# Patient Record
Sex: Female | Born: 1995
Health system: Southern US, Community
[De-identification: ages and names within clinical notes are randomized; demographics above are authoritative.]

## PROBLEM LIST (undated history)

## (undated) ENCOUNTER — Inpatient Hospital Stay (HOSPITAL_COMMUNITY): Payer: Self-pay

## (undated) DIAGNOSIS — F410 Panic disorder [episodic paroxysmal anxiety] without agoraphobia: Secondary | ICD-10-CM

## (undated) DIAGNOSIS — R51 Headache: Secondary | ICD-10-CM

## (undated) DIAGNOSIS — R112 Nausea with vomiting, unspecified: Secondary | ICD-10-CM

## (undated) DIAGNOSIS — Z9889 Other specified postprocedural states: Secondary | ICD-10-CM

## (undated) DIAGNOSIS — R519 Headache, unspecified: Secondary | ICD-10-CM

## (undated) DIAGNOSIS — I10 Essential (primary) hypertension: Secondary | ICD-10-CM

## (undated) HISTORY — PX: WISDOM TOOTH EXTRACTION: SHX21

---
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
## 2013-04-09 ENCOUNTER — Encounter (HOSPITAL_COMMUNITY): Payer: Self-pay | Admitting: Emergency Medicine

## 2013-04-09 ENCOUNTER — Emergency Department (HOSPITAL_COMMUNITY)
Admission: EM | Admit: 2013-04-09 | Discharge: 2013-04-09 | Disposition: A | Discharge status: Home / Self Care | Payer: Self-pay | Attending: Emergency Medicine | Admitting: Emergency Medicine

## 2013-04-09 DIAGNOSIS — Z79899 Other long term (current) drug therapy: Secondary | ICD-10-CM | POA: Insufficient documentation

## 2013-04-09 DIAGNOSIS — Z3202 Encounter for pregnancy test, result negative: Secondary | ICD-10-CM | POA: Insufficient documentation

## 2013-04-09 DIAGNOSIS — Z880 Allergy status to penicillin: Secondary | ICD-10-CM | POA: Insufficient documentation

## 2013-04-09 DIAGNOSIS — F410 Panic disorder [episodic paroxysmal anxiety] without agoraphobia: Secondary | ICD-10-CM | POA: Insufficient documentation

## 2013-04-09 DIAGNOSIS — J029 Acute pharyngitis, unspecified: Secondary | ICD-10-CM | POA: Insufficient documentation

## 2013-04-09 DIAGNOSIS — I10 Essential (primary) hypertension: Secondary | ICD-10-CM | POA: Insufficient documentation

## 2013-04-09 DIAGNOSIS — N390 Urinary tract infection, site not specified: Secondary | ICD-10-CM | POA: Insufficient documentation

## 2013-04-09 HISTORY — DX: Essential (primary) hypertension: I10

## 2013-04-09 HISTORY — DX: Panic disorder (episodic paroxysmal anxiety): F41.0

## 2013-04-09 LAB — URINE MICROSCOPIC-ADD ON

## 2013-04-09 LAB — URINALYSIS, ROUTINE W REFLEX MICROSCOPIC
Bilirubin Urine: NEGATIVE
Glucose, UA: NEGATIVE mg/dL
KETONES UR: NEGATIVE mg/dL
NITRITE: NEGATIVE
Protein, ur: 30 mg/dL — AB
SPECIFIC GRAVITY, URINE: 1.02 (ref 1.005–1.030)
UROBILINOGEN UA: 1 mg/dL (ref 0.0–1.0)
pH: 7 (ref 5.0–8.0)

## 2013-04-09 LAB — POC URINE PREG, ED: Preg Test, Ur: NEGATIVE

## 2013-04-09 MED ORDER — CEPHALEXIN 500 MG PO CAPS: 500.0000 mg | ORAL_CAPSULE | Freq: Four times a day (QID) | ORAL | Status: DC

## 2013-04-09 MED ORDER — IBUPROFEN 800 MG PO TABS
800.0000 mg | ORAL_TABLET | Freq: Once | ORAL | Status: AC
  Administered 2013-04-09: 800 mg via ORAL
  Filled 2013-04-09: qty 1

## 2013-04-09 MED ORDER — IBUPROFEN 800 MG PO TABS: 800.0000 mg | ORAL_TABLET | Freq: Three times a day (TID) | ORAL | Status: DC

## 2013-04-09 MED ORDER — CEPHALEXIN 500 MG PO CAPS
500.0000 mg | ORAL_CAPSULE | Freq: Once | ORAL | Status: AC
  Administered 2013-04-09: 500 mg via ORAL
  Filled 2013-04-09: qty 1

## 2013-04-09 NOTE — ED Notes (Signed)
Patient given discharge instruction, verbalized understand. Patient ambulatory out of the department.  

## 2013-04-09 NOTE — Discharge Instructions (Signed)
Pharyngitis °Pharyngitis is a sore throat (pharynx). There is redness, pain, and swelling of your throat. °HOME CARE  °· Drink enough fluids to keep your pee (urine) clear or pale yellow. °· Only take medicine as told by your doctor. °· You may get sick again if you do not take medicine as told. Finish your medicines, even if you start to feel better. °· Do not take aspirin. °· Rest. °· Rinse your mouth (gargle) with salt water (½ tsp of salt per 1 qt of water) every 1 2 hours. This will help the pain. °· If you are not at risk for choking, you can suck on hard candy or sore throat lozenges. °GET HELP IF: °· You have large, tender lumps on your neck. °· You have a rash. °· You cough up green, yellow-brown, or bloody spit. °GET HELP RIGHT AWAY IF:  °· You have a stiff neck. °· You drool or cannot swallow liquids. °· You throw up (vomit) or are not able to keep medicine or liquids down. °· You have very bad pain that does not go away with medicine. °· You have problems breathing (not from a stuffy nose). °MAKE SURE YOU:  °· Understand these instructions. °· Will watch your condition. °· Will get help right away if you are not doing well or get worse. °Document Released: 06/15/2007 Document Revised: 10/17/2012 Document Reviewed: 09/03/2012 °[employer]® Patient Information ©2014 [employer]. ° °

## 2013-04-09 NOTE — ED Provider Notes (Signed)
CSN: [id number]     Arrival date & time 04/09/13  1100 History   First MD Initiated Contact with Patient 04/09/13 1231     Chief Complaint  Patient presents with  . Sore Throat  . Back Pain     (Consider location/radiation/quality/duration/timing/severity/associated sxs/prior Treatment) HPI Comments: Patient is a 18 year old female who presents to the emergency department with complaint of sore throat and back pain. The patient states that the sore throat is been going on for 2-3 days. Seems to be getting progressively worse. The patient states that she can swallow liquids. She's not had any high fever that she is aware of. She has been around other people have been ill.  The patient describes the back pain. She describes increasing urine frequency. She also states that her urine looks much more dark and concentrated than usual. It is near her menstrual cycle. She's not had any other symptoms related to her urinary tract. His been no injury to the back.  Patient is a 18 y.o. female presenting with pharyngitis and back pain. The history is provided by the patient.  Sore Throat Associated symptoms include a sore throat. Pertinent negatives include no abdominal pain, arthralgias, chest pain, coughing or neck pain.  Back Pain Associated symptoms: no abdominal pain, no chest pain and no dysuria     Past Medical History  Diagnosis Date  . Hypertension   . Panic attacks    History reviewed. No pertinent past surgical history. History reviewed. No pertinent family history. History  Substance Use Topics  . Smoking status: Never Smoker   . Smokeless tobacco: Not on file  . Alcohol Use: No   OB History   Grav Para Term Preterm Abortions TAB SAB Ect Mult Living                 Review of Systems  Constitutional: Negative for activity change.       All ROS Neg except as noted in HPI  HENT: Positive for sore throat. Negative for nosebleeds.   Eyes: Negative for photophobia and discharge.   Respiratory: Negative for cough, shortness of breath and wheezing.   Cardiovascular: Negative for chest pain and palpitations.  Gastrointestinal: Negative for abdominal pain and blood in stool.  Genitourinary: Positive for frequency. Negative for dysuria and hematuria.  Musculoskeletal: Positive for back pain. Negative for arthralgias and neck pain.  Skin: Negative.   Neurological: Negative for dizziness, seizures and speech difficulty.  Psychiatric/Behavioral: Negative for hallucinations and confusion.      Allergies  Amoxicillin and Other  Home Medications   Current Outpatient Rx  Name  Route  Sig  Dispense  Refill  . FLUoxetine (PROZAC) 20 MG capsule   Oral   Take 20 mg by mouth daily.          BP 152/86  Pulse 83  Temp(Src) 98.7 F (37.1 C) (Oral)  Resp 12  Ht 5\' 4"  (1.626 m)  Wt 130 lb 12.8 oz (59.33 kg)  BMI 22.44 kg/m2  SpO2 100%  LMP 03/15/2013 Physical Exam  Nursing note and vitals reviewed. Constitutional: She is oriented to person, place, and time. She appears well-developed and well-nourished.  Non-toxic appearance.  HENT:  Head: Normocephalic.  Right Ear: Tympanic membrane and external ear normal.  Left Ear: Tympanic membrane and external ear normal.  Increase redness present. Airway patent. Speech clear.  Eyes: EOM and lids are normal. Pupils are equal, round, and reactive to light.  Neck: Normal range of motion. Neck supple.  Carotid bruit is not present.  Cardiovascular: Normal rate, regular rhythm, normal heart sounds, intact distal pulses and normal pulses.   Pulmonary/Chest: Breath sounds normal. No respiratory distress.  Abdominal: Soft. Bowel sounds are normal. There is no tenderness. There is no guarding.  Mild left CVA tenderness.  Musculoskeletal: Normal range of motion.  Lower back pain to ROm.  Lymphadenopathy:       Head (right side): No submandibular adenopathy present.       Head (left side): No submandibular adenopathy present.     She has no cervical adenopathy.  Neurological: She is alert and oriented to person, place, and time. She has normal strength. No cranial nerve deficit or sensory deficit.  Skin: Skin is warm and dry.  Psychiatric: She has a normal mood and affect. Her speech is normal.    ED Course  Procedures (including critical care time) Labs Review Labs Reviewed  URINALYSIS, ROUTINE W REFLEX MICROSCOPIC - Abnormal; Notable for the following:    Color, Urine BROWN (*)    APPearance CLOUDY (*)    Hgb urine dipstick LARGE (*)    Protein, ur 30 (*)    Leukocytes, UA MODERATE (*)    All other components within normal limits  URINE MICROSCOPIC-ADD ON - Abnormal; Notable for the following:    Bacteria, UA FEW (*)    All other components within normal limits  POC URINE PREG, ED   Imaging Review No results found.   EKG Interpretation None      MDM Examination is consistent with pharyngitis. Urinalysis shows a brown cloudy specimen with a large amount of blood and moderate leukocyte esterase. The patient will be placed on Keflex and ibuprofen. A culture of the urine will be sent to the lab. Patient is to return if any changes, problems, or concerns.    Final diagnoses:  None    *I have reviewed nursing notes, vital signs, and all appropriate lab and imaging results for this patient.Kathie Dike**    Gaynor Genco M Jorah Hua, PA-C 04/09/13 [phone]

## 2013-04-09 NOTE — ED Notes (Signed)
Pt now complaining of back pain and having urinary frequency, Urine specimen sent to lab, cloudy pink sample

## 2013-04-09 NOTE — ED Notes (Signed)
Sore throat, headache,  Back hurts. Decreased appetite.

## 2013-04-10 NOTE — ED Provider Notes (Signed)
Medical screening examination/treatment/procedure(s) were performed by non-physician practitioner and as supervising physician I was immediately available for consultation/collaboration.   Celene KrasJon R Tonesha Tsou, MD 04/10/13 [phone]

## 2013-04-12 LAB — URINE CULTURE

## 2013-04-13 ENCOUNTER — Telehealth (HOSPITAL_BASED_OUTPATIENT_CLINIC_OR_DEPARTMENT_OTHER): Payer: Self-pay | Admitting: Emergency Medicine

## 2013-04-13 NOTE — Telephone Encounter (Signed)
Post ED Visit - Positive Culture Follow-up  Culture report reviewed by antimicrobial stewardship pharmacist: [] Wes Dulaney, Pharm.D., BCPS [x] Jeremy Frens, Pharm.D., BCPS [] Elizabeth Martin, Pharm.D., BCPS [] Minh Pham, Pharm.D., BCPS, AAHIVP [] Michelle Turner, Pharm.D., BCPS, AAHIVP  Positive urine culture Treated with Keflex, organism sensitive to the same and no further patient follow-up is required at this time.  Elaine Hernandez 04/13/2013, 10:41 AM   

## 2013-04-24 ENCOUNTER — Emergency Department (HOSPITAL_COMMUNITY)
Admission: EM | Admit: 2013-04-24 | Discharge: 2013-04-25 | Disposition: A | Discharge status: Home / Self Care | Payer: Self-pay | Attending: Emergency Medicine | Admitting: Emergency Medicine

## 2013-04-24 ENCOUNTER — Encounter (HOSPITAL_COMMUNITY): Payer: Self-pay | Admitting: Emergency Medicine

## 2013-04-24 DIAGNOSIS — Z791 Long term (current) use of non-steroidal anti-inflammatories (NSAID): Secondary | ICD-10-CM | POA: Insufficient documentation

## 2013-04-24 DIAGNOSIS — F410 Panic disorder [episodic paroxysmal anxiety] without agoraphobia: Secondary | ICD-10-CM | POA: Insufficient documentation

## 2013-04-24 DIAGNOSIS — R072 Precordial pain: Secondary | ICD-10-CM | POA: Insufficient documentation

## 2013-04-24 DIAGNOSIS — I10 Essential (primary) hypertension: Secondary | ICD-10-CM | POA: Insufficient documentation

## 2013-04-24 DIAGNOSIS — F411 Generalized anxiety disorder: Secondary | ICD-10-CM | POA: Insufficient documentation

## 2013-04-24 DIAGNOSIS — R002 Palpitations: Secondary | ICD-10-CM | POA: Insufficient documentation

## 2013-04-24 DIAGNOSIS — R079 Chest pain, unspecified: Secondary | ICD-10-CM

## 2013-04-24 DIAGNOSIS — Z792 Long term (current) use of antibiotics: Secondary | ICD-10-CM | POA: Insufficient documentation

## 2013-04-24 DIAGNOSIS — Z79899 Other long term (current) drug therapy: Secondary | ICD-10-CM | POA: Insufficient documentation

## 2013-04-24 DIAGNOSIS — Z880 Allergy status to penicillin: Secondary | ICD-10-CM | POA: Insufficient documentation

## 2013-04-24 NOTE — ED Notes (Signed)
Pt c/o sob, palpitations, and chest tightness. Pt states she has a history of panic attacks and took Prozac that they gave her for a panic disorder. Pt states theses symptoms started earlier today and have gotten progressively worse.

## 2013-04-24 NOTE — ED Provider Notes (Signed)
CSN: [id number]     Arrival date & time 04/24/13  2103 History  This chart was scribed for Elaine RollerBrian D Leotha Westermeyer, MD by Danella Maiersaroline Early, ED Scribe. This patient was seen in room APA08/APA08 and the patient's care was started at 11:47 PM.    Chief Complaint  Patient presents with  . Shortness of Breath   The history is provided by the patient. No language interpreter was used.   HPI Comments: April Elaine Hernandez is a [age] y.o. female with a h/o anxiety who presents to the Emergency Department complaining of sudden-onset constant sharp left substernal CP onset tonight with racing heart and heavy breathing. She states the pain worsened with deep breathing. States she came in because she was concerned about her heart. She denies h/o cardiac problems. She states she has been stressed out this week due to grandma being diagnosed with lung cancer and uncle hit by a car yesterday. She states she was diagnosed with anxiety disorder last year at the ER after coming in for a panic attack. She saw a family doc and was prescribed Prozac.     Past Medical History  Diagnosis Date  . Hypertension   . Panic attacks    History reviewed. No pertinent past surgical history. History reviewed. No pertinent family history. History  Substance Use Topics  . Smoking status: Never Smoker   . Smokeless tobacco: Not on file  . Alcohol Use: No   OB History   Grav Para Term Preterm Abortions TAB SAB Ect Mult Living                 Review of Systems  Respiratory: Positive for shortness of breath.   Cardiovascular: Positive for chest pain and palpitations. Negative for leg swelling.  Gastrointestinal: Negative for nausea.  Neurological: Negative for numbness.  Psychiatric/Behavioral: The patient is nervous/anxious.   All other systems reviewed and are negative.     Allergies  Amoxicillin and Other  Home Medications   Prior to Admission medications   Medication Sig Start Date End Date Taking? Authorizing Provider   cephALEXin (KEFLEX) 500 MG capsule Take 1 capsule (500 mg total) by mouth 4 (four) times daily. 04/09/13   Kathie DikeHobson M Bryant, PA-C  FLUoxetine (PROZAC) 20 MG capsule Take 20 mg by mouth daily.    Historical Provider, MD  ibuprofen (ADVIL,MOTRIN) 800 MG tablet Take 1 tablet (800 mg total) by mouth 3 (three) times daily. 04/09/13   Kathie DikeHobson M Bryant, PA-C   BP 143/87  Pulse 97  Resp 20  Ht 5\' 4"  (1.626 m)  Wt 135 lb (61.236 kg)  BMI 23.16 kg/m2  SpO2 100%  LMP 04/14/2013 Physical Exam  Nursing note and vitals reviewed. Constitutional: She appears well-developed and well-nourished. No distress.  HENT:  Head: Normocephalic and atraumatic.  Eyes: Conjunctivae are normal. Right eye exhibits no discharge. Left eye exhibits no discharge.  Cardiovascular: Normal rate and regular rhythm.   No murmur heard. Pulmonary/Chest: Effort normal and breath sounds normal. She exhibits tenderness (Reproducible chest tenderness underneath the right breast over to the chest wall).  Chaperone present  Abdominal: Soft. She exhibits no distension. There is no tenderness. There is no rebound and no guarding.  Musculoskeletal: She exhibits no edema and no tenderness.  Lymphadenopathy:    She has no cervical adenopathy.  Neurological: She is alert. Coordination normal.  Skin: Skin is warm. No rash noted.  Psychiatric: She has a normal mood and affect. Her behavior is normal.    ED Course  Procedures (including critical care time) Medications  naproxen (NAPROSYN) tablet 500 mg (not administered)    DIAGNOSTIC STUDIES: Oxygen Saturation is 100% on RA, normal by my interpretation.    COORDINATION OF CARE: 11:55 PM- Discussed treatment plan with pt. Pt agrees to plan.    Labs Review Labs Reviewed - No data to display  Imaging Review Dg Chest 2 View  04/25/2013   CLINICAL DATA:  Chest pain  EXAM: CHEST  2 VIEW  COMPARISON:  None.  FINDINGS: The heart size and mediastinal contours are within normal  limits. Both lungs are clear. The visualized skeletal structures are unremarkable.  IMPRESSION: No active cardiopulmonary disease.   Electronically Signed   By: Elige KoHetal  Patel   On: 04/25/2013 00:39     EKG Interpretation   Date/Time:  Wednesday April 24 2013 21:10:44 EDT Ventricular Rate:  96 PR Interval:  180 QRS Duration: 82 QT Interval:  356 QTC Calculation: 449 R Axis:   75 Text Interpretation:  Normal sinus rhythm Possible Left atrial enlargement  Abnormal ECG No previous ECGs available Confirmed by Mariaclara Spear  MD, Linnea Todisco  ([id number]) on 04/25/2013 12:46:22 AM      MDM   Final diagnoses:  Palpitations  Chest pain   CXR and ECG normal - pt has reproducible pain to palpation, normal VS, no tachycardia on my exam and no swelling or other rf for PE.  She is stable for d/c at this time.  Naprosyn given.  Meds given in ED:  Medications  naproxen (NAPROSYN) tablet 500 mg (not administered)    New Prescriptions   NAPROXEN (NAPROSYN) 500 MG TABLET    Take 1 tablet (500 mg total) by mouth 2 (two) times daily with a meal.      I personally performed the services described in this documentation, which was scribed in my presence. The recorded information has been reviewed and is accurate.      Elaine RollerBrian D [name], MD 04/25/13 [phone]

## 2013-04-25 ENCOUNTER — Emergency Department (HOSPITAL_COMMUNITY): Payer: Self-pay

## 2013-04-25 MED ORDER — NAPROXEN 250 MG PO TABS
500.0000 mg | ORAL_TABLET | Freq: Once | ORAL | Status: AC
  Administered 2013-04-25: 500 mg via ORAL
  Filled 2013-04-25: qty 2

## 2013-04-25 MED ORDER — NAPROXEN 500 MG PO TABS: 500.0000 mg | ORAL_TABLET | Freq: Two times a day (BID) | ORAL | Status: DC

## 2013-04-25 NOTE — Discharge Instructions (Signed)
Naprosyn twice daily for pain, your heart and lungs look normal on testing.  Please call your doctor for a followup appointment within 24-48 hours. When you talk to your doctor please let them know that you were seen in the emergency department and have them acquire all of your records so that they can discuss the findings with you and formulate a treatment plan to fully care for your new and ongoing problems.

## 2013-06-07 ENCOUNTER — Emergency Department (HOSPITAL_COMMUNITY)
Admission: EM | Admit: 2013-06-07 | Discharge: 2013-06-07 | Disposition: A | Discharge status: Home / Self Care | Payer: Self-pay | Attending: Emergency Medicine | Admitting: Emergency Medicine

## 2013-06-07 ENCOUNTER — Encounter (HOSPITAL_COMMUNITY): Payer: Self-pay | Admitting: Emergency Medicine

## 2013-06-07 DIAGNOSIS — N939 Abnormal uterine and vaginal bleeding, unspecified: Secondary | ICD-10-CM

## 2013-06-07 DIAGNOSIS — F410 Panic disorder [episodic paroxysmal anxiety] without agoraphobia: Secondary | ICD-10-CM | POA: Insufficient documentation

## 2013-06-07 DIAGNOSIS — M549 Dorsalgia, unspecified: Secondary | ICD-10-CM | POA: Insufficient documentation

## 2013-06-07 DIAGNOSIS — Z880 Allergy status to penicillin: Secondary | ICD-10-CM | POA: Insufficient documentation

## 2013-06-07 DIAGNOSIS — Z79899 Other long term (current) drug therapy: Secondary | ICD-10-CM | POA: Insufficient documentation

## 2013-06-07 DIAGNOSIS — R109 Unspecified abdominal pain: Secondary | ICD-10-CM | POA: Insufficient documentation

## 2013-06-07 DIAGNOSIS — N898 Other specified noninflammatory disorders of vagina: Secondary | ICD-10-CM | POA: Insufficient documentation

## 2013-06-07 DIAGNOSIS — Z3202 Encounter for pregnancy test, result negative: Secondary | ICD-10-CM | POA: Insufficient documentation

## 2013-06-07 DIAGNOSIS — I10 Essential (primary) hypertension: Secondary | ICD-10-CM | POA: Insufficient documentation

## 2013-06-07 LAB — URINALYSIS, ROUTINE W REFLEX MICROSCOPIC
BILIRUBIN URINE: NEGATIVE
GLUCOSE, UA: NEGATIVE mg/dL
KETONES UR: NEGATIVE mg/dL
Nitrite: NEGATIVE
Protein, ur: NEGATIVE mg/dL
Specific Gravity, Urine: 1.015 (ref 1.005–1.030)
Urobilinogen, UA: 0.2 mg/dL (ref 0.0–1.0)
pH: 7 (ref 5.0–8.0)

## 2013-06-07 LAB — URINE MICROSCOPIC-ADD ON

## 2013-06-07 LAB — RPR

## 2013-06-07 LAB — HIV ANTIBODY (ROUTINE TESTING W REFLEX): HIV: NONREACTIVE

## 2013-06-07 LAB — PREGNANCY, URINE: Preg Test, Ur: NEGATIVE

## 2013-06-07 MED ORDER — IBUPROFEN 800 MG PO TABS
800.0000 mg | ORAL_TABLET | Freq: Once | ORAL | Status: AC
  Administered 2013-06-07: 800 mg via ORAL
  Filled 2013-06-07: qty 1

## 2013-06-07 NOTE — Care Management Note (Signed)
ED/CM noted patient did not have health insurance and/or PCP listed in the computer.  Patient was given the [place] resource handout with information on the clinics, food pantries, and the handout for new health insurance sign-up.  Patient expressed appreciation for information received. 

## 2013-06-07 NOTE — ED Provider Notes (Signed)
CSN: [id number]     Arrival date & time 06/07/13  0919 History  This chart was scribed for Ward Givens, MD by Shari Heritage, ED Scribe. The patient was seen in room APA09/APA09. Patient's care was started at 10:19 AM.    Chief Complaint  Patient presents with  . Vaginal Bleeding    The history is provided by the patient. No language interpreter was used.    HPI Comments: Elaine Hernandez is a 18 y.o. female who presents to the Emergency Department complaining of constant vaginal bleeding that began 3-4 days ago. She states that her current bleeding is heavier than her normal menstrual bleeding and she has also noticed small clots. She states that her menstrual cycles usually last 4-6 days. She was not having any discharge prior to onset of vaginal bleeding. She was on oral contraceptives, but she has been off them for 1 year and has had normal menstrual cycles every month, until last month, when she did not have a menstrual cycle which prompted her to take a home pregnancy test. Patient took several home pregnancy tests in mid-April - one was positive, one was undiagnostic, and two other were negative. She was seen at [facility] ED for abdominal pain in mid-April as well and had a negative pregnancy test there. Currently, she reports associated breast soreness that began 1 week ago. She is also having abdominal pain and lower back pain. She denies dysuria, urinary frequency, fever, or other symptoms at this time. She had nausea in Mid April, but not now.  She is G0P0Ab0. She has had one partner for the past 6 months and uses prophylactics intermittently.    PCP - Wyvonnia Lora She does not have a GYN.   Past Medical History  Diagnosis Date  . Hypertension   . Panic attacks    History reviewed. No pertinent past surgical history. History reviewed. No pertinent family history. History  Substance Use Topics  . Smoking status: Never Smoker   . Smokeless tobacco: Not on file  . Alcohol Use: No   She works at a call center.  OB History   Grav Para Term Preterm Abortions TAB SAB Ect Mult Living   0 0             Review of Systems  Constitutional: Negative for fever.  Gastrointestinal: Positive for abdominal pain.  Genitourinary: Positive for vaginal bleeding and menstrual problem. Negative for dysuria and frequency.       Positive for breast soreness.  Musculoskeletal: Positive for back pain.  All other systems reviewed and are negative.   Allergies  Amoxicillin and Other  Home Medications   Prior to Admission medications   Medication Sig Start Date End Date Taking? Authorizing Provider  FLUoxetine (PROZAC) 20 MG capsule Take 20 mg by mouth daily.    Historical Provider, MD   BP 138/99  Pulse 99  Temp(Src) 98.8 F (37.1 C) (Oral)  Resp 18  Ht 5\' 4"  (1.626 m)  SpO2 100%  LMP 04/07/2013  Vital signs normal   Physical Exam  Nursing note and vitals reviewed. Constitutional: She is oriented to person, place, and time. She appears well-developed and well-nourished.  Non-toxic appearance. She does not appear ill. No distress.  HENT:  Head: Normocephalic and atraumatic.  Right Ear: External ear normal.  Left Ear: External ear normal.  Nose: Nose normal. No mucosal edema or rhinorrhea.  Mouth/Throat: Oropharynx is clear and moist and mucous membranes are normal. No dental abscesses or uvula  swelling.  Eyes: Conjunctivae and EOM are normal. Pupils are equal, round, and reactive to light.  Neck: Normal range of motion and full passive range of motion without pain. Neck supple.  Cardiovascular: Normal rate, regular rhythm and normal heart sounds.  Exam reveals no gallop and no friction rub.   No murmur heard. Pulmonary/Chest: Effort normal and breath sounds normal. No respiratory distress. She has no wheezes. She has no rhonchi. She has no rales. She exhibits no tenderness and no crepitus.  Abdominal: Soft. Normal appearance and bowel sounds are normal. She exhibits no  distension. There is tenderness. There is no rebound and no guarding.    Mild suprapubic tenderness.  Genitourinary:  ED extremely busy, pt had to leave just as I had time to do her pelvic exam.   Musculoskeletal: Normal range of motion. She exhibits no edema and no tenderness.  Moves all extremities well.   Neurological: She is alert and oriented to person, place, and time. She has normal strength. No cranial nerve deficit.  Skin: Skin is warm, dry and intact. No rash noted. No erythema. No pallor.  Psychiatric: She has a normal mood and affect. Her speech is normal and behavior is normal. Her mood appears not anxious.    ED Course  Procedures (including critical care time)  Medications  ibuprofen (ADVIL,MOTRIN) tablet 800 mg (800 mg Oral Given 06/07/13 1035)    DIAGNOSTIC STUDIES: Oxygen Saturation is 100% on room air, normal by my interpretation.    COORDINATION OF CARE: 10:29 AM- Patient informed of current plan for treatment and evaluation and agrees with plan at this time.  12:59 PM - Patient is improved after ibuprofen. She decided to leave before receiving her pelvic exam.   Results for orders placed during the hospital encounter of 06/07/13  PREGNANCY, URINE      Result Value Ref Range   Preg Test, Ur NEGATIVE  NEGATIVE  URINALYSIS, ROUTINE W REFLEX MICROSCOPIC      Result Value Ref Range   Color, Urine YELLOW  YELLOW   APPearance CLEAR  CLEAR   Specific Gravity, Urine 1.015  1.005 - 1.030   pH 7.0  5.0 - 8.0   Glucose, UA NEGATIVE  NEGATIVE mg/dL   Hgb urine dipstick LARGE (*) NEGATIVE   Bilirubin Urine NEGATIVE  NEGATIVE   Ketones, ur NEGATIVE  NEGATIVE mg/dL   Protein, ur NEGATIVE  NEGATIVE mg/dL   Urobilinogen, UA 0.2  0.0 - 1.0 mg/dL   Nitrite NEGATIVE  NEGATIVE   Leukocytes, UA TRACE (*) NEGATIVE  URINE MICROSCOPIC-ADD ON      Result Value Ref Range   Squamous Epithelial / LPF RARE  RARE   WBC, UA 0-2  <3 WBC/hpf   RBC / HPF 7-10  <3 RBC/hpf    Laboratory interpretation all normal except hematuria (pt on menses, voided sample)   Imaging Review No results found.   EKG Interpretation None      MDM  We had discussed her menses was heavier because she had missed a month, I was going to do a pelvic exam to make sure she didn't have an infection which was making her periods irregular but she had to leave.    Final diagnoses:  Vaginal bleeding   PT left AMA  Devoria AlbeIva Errick Salts, MD, FACEP   I personally performed the services described in this documentation, which was scribed in my presence. The recorded information has been reviewed and considered.  Plan discharge    Khyran Riera Hildred LaserL Reigna Ruperto,  MD 06/07/13 1412

## 2013-06-07 NOTE — ED Notes (Signed)
Pt last period end of May, numerous negative home pregnancy. Wednesday pt experiencing small, that afternoon bleeding heavily with clumps of blood, with severe pain continued into Thursday, pt continues to bleed heavy no dark blood with lower pain and lower abdominal pain.

## 2013-08-30 ENCOUNTER — Emergency Department (HOSPITAL_COMMUNITY): Payer: Self-pay

## 2013-08-30 ENCOUNTER — Emergency Department (HOSPITAL_COMMUNITY)
Admission: EM | Admit: 2013-08-30 | Discharge: 2013-08-30 | Disposition: A | Discharge status: Home / Self Care | Payer: Self-pay | Attending: Emergency Medicine | Admitting: Emergency Medicine

## 2013-08-30 ENCOUNTER — Encounter (HOSPITAL_COMMUNITY): Payer: Self-pay | Admitting: Emergency Medicine

## 2013-08-30 DIAGNOSIS — R5383 Other fatigue: Secondary | ICD-10-CM

## 2013-08-30 DIAGNOSIS — Z880 Allergy status to penicillin: Secondary | ICD-10-CM | POA: Insufficient documentation

## 2013-08-30 DIAGNOSIS — R638 Other symptoms and signs concerning food and fluid intake: Secondary | ICD-10-CM | POA: Insufficient documentation

## 2013-08-30 DIAGNOSIS — N949 Unspecified condition associated with female genital organs and menstrual cycle: Secondary | ICD-10-CM | POA: Insufficient documentation

## 2013-08-30 DIAGNOSIS — R5381 Other malaise: Secondary | ICD-10-CM | POA: Insufficient documentation

## 2013-08-30 DIAGNOSIS — R42 Dizziness and giddiness: Secondary | ICD-10-CM | POA: Insufficient documentation

## 2013-08-30 DIAGNOSIS — N925 Other specified irregular menstruation: Secondary | ICD-10-CM | POA: Insufficient documentation

## 2013-08-30 DIAGNOSIS — Z8659 Personal history of other mental and behavioral disorders: Secondary | ICD-10-CM | POA: Insufficient documentation

## 2013-08-30 DIAGNOSIS — N938 Other specified abnormal uterine and vaginal bleeding: Secondary | ICD-10-CM | POA: Insufficient documentation

## 2013-08-30 DIAGNOSIS — R1031 Right lower quadrant pain: Secondary | ICD-10-CM | POA: Insufficient documentation

## 2013-08-30 DIAGNOSIS — I10 Essential (primary) hypertension: Secondary | ICD-10-CM | POA: Insufficient documentation

## 2013-08-30 DIAGNOSIS — Z3202 Encounter for pregnancy test, result negative: Secondary | ICD-10-CM | POA: Insufficient documentation

## 2013-08-30 DIAGNOSIS — Z79899 Other long term (current) drug therapy: Secondary | ICD-10-CM | POA: Insufficient documentation

## 2013-08-30 LAB — URINE MICROSCOPIC-ADD ON

## 2013-08-30 LAB — URINALYSIS, ROUTINE W REFLEX MICROSCOPIC
Bilirubin Urine: NEGATIVE
GLUCOSE, UA: NEGATIVE mg/dL
KETONES UR: NEGATIVE mg/dL
Nitrite: NEGATIVE
PROTEIN: NEGATIVE mg/dL
Specific Gravity, Urine: 1.015 (ref 1.005–1.030)
Urobilinogen, UA: 1 mg/dL (ref 0.0–1.0)
pH: 7 (ref 5.0–8.0)

## 2013-08-30 LAB — CBC WITH DIFFERENTIAL/PLATELET
Basophils Absolute: 0 10*3/uL (ref 0.0–0.1)
Basophils Relative: 0 % (ref 0–1)
EOS PCT: 5 % (ref 0–5)
Eosinophils Absolute: 0.5 10*3/uL (ref 0.0–0.7)
HCT: 36.1 % (ref 36.0–46.0)
HEMOGLOBIN: 11.9 g/dL — AB (ref 12.0–15.0)
LYMPHS ABS: 3.3 10*3/uL (ref 0.7–4.0)
LYMPHS PCT: 32 % (ref 12–46)
MCH: 25.5 pg — ABNORMAL LOW (ref 26.0–34.0)
MCHC: 33 g/dL (ref 30.0–36.0)
MCV: 77.5 fL — AB (ref 78.0–100.0)
Monocytes Absolute: 0.6 10*3/uL (ref 0.1–1.0)
Monocytes Relative: 6 % (ref 3–12)
NEUTROS ABS: 5.7 10*3/uL (ref 1.7–7.7)
Neutrophils Relative %: 57 % (ref 43–77)
Platelets: 244 10*3/uL (ref 150–400)
RBC: 4.66 MIL/uL (ref 3.87–5.11)
RDW: 14.8 % (ref 11.5–15.5)
WBC: 10.2 10*3/uL (ref 4.0–10.5)

## 2013-08-30 LAB — BASIC METABOLIC PANEL
Anion gap: 11 (ref 5–15)
BUN: 11 mg/dL (ref 6–23)
CHLORIDE: 105 meq/L (ref 96–112)
CO2: 24 mEq/L (ref 19–32)
CREATININE: 0.62 mg/dL (ref 0.50–1.10)
Calcium: 8.8 mg/dL (ref 8.4–10.5)
GFR calc Af Amer: >90 mL/min (ref >90)
GFR calc non Af Amer: >90 mL/min (ref >90)
GLUCOSE: 104 mg/dL — AB (ref 70–99)
Potassium: 3.7 mEq/L (ref 3.7–5.3)
Sodium: 140 mEq/L (ref 137–147)

## 2013-08-30 LAB — PREGNANCY, URINE: PREG TEST UR: NEGATIVE

## 2013-08-30 NOTE — Discharge Instructions (Signed)
Magnesium citrate: Drink 2/3 of a 10 ounce bottle mixed with equal parts Sprite or Gatorade.  Return to the emergency department if you develop high fever, worsening pain, bloody stool, or other new and concerning symptoms.   Abdominal Pain, Women Abdominal (stomach, pelvic, or belly) pain can be caused by many things. It is important to tell your doctor:  The location of the pain.  Does it come and go or is it present all the time?  Are there things that start the pain (eating certain foods, exercise)?  Are there other symptoms associated with the pain (fever, nausea, vomiting, diarrhea)? All of this is helpful to know when trying to find the cause of the pain. CAUSES   Stomach: virus or bacteria infection, or ulcer.  Intestine: appendicitis (inflamed appendix), regional ileitis (Crohn's disease), ulcerative colitis (inflamed colon), irritable bowel syndrome, diverticulitis (inflamed diverticulum of the colon), or cancer of the stomach or intestine.  Gallbladder disease or stones in the gallbladder.  Kidney disease, kidney stones, or infection.  Pancreas infection or cancer.  Fibromyalgia (pain disorder).  Diseases of the female organs:  Uterus: fibroid (non-cancerous) tumors or infection.  Fallopian tubes: infection or tubal pregnancy.  Ovary: cysts or tumors.  Pelvic adhesions (scar tissue).  Endometriosis (uterus lining tissue growing in the pelvis and on the pelvic organs).  Pelvic congestion syndrome (female organs filling up with blood just before the menstrual period).  Pain with the menstrual period.  Pain with ovulation (producing an egg).  Pain with an IUD (intrauterine device, birth control) in the uterus.  Cancer of the female organs.  Functional pain (pain not caused by a disease, may improve without treatment).  Psychological pain.  Depression. DIAGNOSIS  Your doctor will decide the seriousness of your pain by doing an examination.  Blood  tests.  X-rays.  Ultrasound.  CT scan (computed tomography, special type of X-ray).  MRI (magnetic resonance imaging).  Cultures, for infection.  Barium enema (dye inserted in the large intestine, to better view it with X-rays).  Colonoscopy (looking in intestine with a lighted tube).  Laparoscopy (minor surgery, looking in abdomen with a lighted tube).  Major abdominal exploratory surgery (looking in abdomen with a large incision). TREATMENT  The treatment will depend on the cause of the pain.   Many cases can be observed and treated at home.  Over-the-counter medicines recommended by your caregiver.  Prescription medicine.  Antibiotics, for infection.  Birth control pills, for painful periods or for ovulation pain.  Hormone treatment, for endometriosis.  Nerve blocking injections.  Physical therapy.  Antidepressants.  Counseling with a psychologist or psychiatrist.  Minor or major surgery. HOME CARE INSTRUCTIONS   Do not take laxatives, unless directed by your caregiver.  Take over-the-counter pain medicine only if ordered by your caregiver. Do not take aspirin because it can cause an upset stomach or bleeding.  Try a clear liquid diet (broth or water) as ordered by your caregiver. Slowly move to a bland diet, as tolerated, if the pain is related to the stomach or intestine.  Have a thermometer and take your temperature several times a day, and record it.  Bed rest and sleep, if it helps the pain.  Avoid sexual intercourse, if it causes pain.  Avoid stressful situations.  Keep your follow-up appointments and tests, as your caregiver orders.  If the pain does not go away with medicine or surgery, you may try:  Acupuncture.  Relaxation exercises (yoga, meditation).  Group therapy.  Counseling. SEEK  MEDICAL CARE IF:   You notice certain foods cause stomach pain.  Your home care treatment is not helping your pain.  You need stronger pain  medicine.  You want your IUD removed.  You feel faint or lightheaded.  You develop nausea and vomiting.  You develop a rash.  You are having side effects or an allergy to your medicine. SEEK IMMEDIATE MEDICAL CARE IF:   Your pain does not go away or gets worse.  You have a fever.  Your pain is felt only in portions of the abdomen. The right side could possibly be appendicitis. The left lower portion of the abdomen could be colitis or diverticulitis.  You are passing blood in your stools (bright red or black tarry stools, with or without vomiting).  You have blood in your urine.  You develop chills, with or without a fever.  You pass out. MAKE SURE YOU:   Understand these instructions.  Will watch your condition.  Will get help right away if you are not doing well or get worse. Document Released: 10/24/2006 Document Revised: 05/13/2013 Document Reviewed: 11/13/2008 [facility] Patient Information 2015 [place], [place]. This information is not intended to replace advice given to you by your health care provider. Make sure you discuss any questions you have with your health care provider.

## 2013-08-30 NOTE — ED Provider Notes (Signed)
CSN: [id number]     Arrival date & time 08/30/13  2113 History  This chart was scribed for Geoffery Lyonsouglas Elaine Toya, MD, by Yevette EdwardsAngela Bracken, ED Scribe. This patient was seen in room APA19/APA19 and the patient's care was started at 10:00 PM.   First MD Initiated Contact with Patient 08/30/13 2158     No chief complaint on file.   HPI HPI Comments: Elaine Hernandez is a 18 y.o. female who presents to the Emergency Department complaining of RLQ pain which has gradually worsened over the course of two days. The pain is increased with movement. She also endorses weakness, dizziness, and abnormal vaginal bleeding. [name] reports she received a Depo IM last month and had sexual intercourse without protection the week following the injection; she is in a monogamous relationship. She denies a fever, vaginal discharge, dysuria, or difficulty urinating. She also denies a h/o abnormal pelvic exams; her last pelvic exam was three months ago. She denies a h/o abdominal surgeries. She reports a family h/o ovarian cysts.   Past Medical History  Diagnosis Date  . Hypertension   . Panic attacks    History reviewed. No pertinent past surgical history. History reviewed. No pertinent family history. History  Substance Use Topics  . Smoking status: Never Smoker   . Smokeless tobacco: Not on file  . Alcohol Use: No   OB History   Grav Para Term Preterm Abortions TAB SAB Ect Mult Living   0 0             Review of Systems  Constitutional: Positive for unexpected weight change. Negative for fever.  Gastrointestinal: Positive for abdominal pain.  Genitourinary: Positive for vaginal bleeding. Negative for dysuria, vaginal discharge and difficulty urinating.  Neurological: Positive for dizziness and weakness.      Allergies  Amoxicillin and Other  Home Medications   Prior to Admission medications   Medication Sig Start Date End Date Taking? Authorizing Provider  acetaminophen (TYLENOL) 500 MG tablet Take  1,000 mg by mouth every 6 (six) hours as needed.   Yes Historical Provider, MD  medroxyPROGESTERone (DEPO-PROVERA) 150 MG/ML injection Inject 150 mg into the muscle every 3 (three) months.   Yes Historical Provider, MD  Multiple Vitamin (MULTIVITAMIN WITH MINERALS) TABS tablet Take 1 tablet by mouth daily.   Yes Historical Provider, MD   Triage Vitals: BP 154/98  Pulse 96  Temp(Src) 98.9 F (37.2 C)  Resp 20  Ht 5' 4.5" (1.638 m)  Wt 148 lb 5 oz (67.274 kg)  BMI 25.07 kg/m2  SpO2 100%  Physical Exam  Nursing note and vitals reviewed. Constitutional: She is oriented to person, place, and time. She appears well-developed and well-nourished. No distress.  HENT:  Head: Normocephalic and atraumatic.  Eyes: Conjunctivae and EOM are normal.  Neck: Neck supple. No tracheal deviation present.  Cardiovascular: Normal rate and regular rhythm.   No murmur heard. Pulmonary/Chest: Effort normal and breath sounds normal. No respiratory distress.  Abdominal: Soft. She exhibits no distension. There is tenderness. There is no rebound and no guarding.  Tenderness to palpation in the RLQ. No rebound. No guarding.   Musculoskeletal: Normal range of motion.  Neurological: She is alert and oriented to person, place, and time.  Skin: Skin is warm and dry.  Psychiatric: She has a normal mood and affect. Her behavior is normal.    ED Course  Procedures (including critical care time)  DIAGNOSTIC STUDIES: Oxygen Saturation is 100% on room air, normal by my  interpretation.    COORDINATION OF CARE:  10:18 PM- Discussed treatment plan with patient, and the patient agreed to the plan. The plan includes lab work and a CT scan.   Labs Review Labs Reviewed  URINALYSIS, ROUTINE W REFLEX MICROSCOPIC - Abnormal; Notable for the following:    APPearance HAZY (*)    Hgb urine dipstick LARGE (*)    Leukocytes, UA SMALL (*)    All other components within normal limits  URINE MICROSCOPIC-ADD ON - Abnormal;  Notable for the following:    Squamous Epithelial / LPF FEW (*)    Bacteria, UA FEW (*)    All other components within normal limits  CBC WITH DIFFERENTIAL - Abnormal; Notable for the following:    Hemoglobin 11.9 (*)    MCV 77.5 (*)    MCH 25.5 (*)    All other components within normal limits  BASIC METABOLIC PANEL - Abnormal; Notable for the following:    Glucose, Bld 104 (*)    All other components within normal limits  PREGNANCY, URINE    Imaging Review No results found.   EKG Interpretation None      MDM   Final diagnoses:  None    Patient presents with right lower abdominal and right flank pain for the past several days. Her pregnancy is negative, thus ruling out an ectopic pregnancy. She has no white count and no findings on her CT scan to indicate appendicitis. Her urinalysis reveals blood but she is currently on her period. She declines a pelvic exam as she is denying any vaginal discharge and denies any new sexual contacts. Her CT scan does reveal what I feel is a significant amount of stool in the right lower quadrant in the area for discomfort. I will recommend magnesium citrate and when necessary followup.  I personally performed the services described in this documentation, which was scribed in my presence. The recorded information has been reviewed and is accurate.      Geoffery Lyons, MD 08/30/13 [phone]

## 2013-08-30 NOTE — ED Notes (Signed)
Pt c/o abdominal pain, weakness, back pain, headache, and is more thirsty. Pt states she just started the depo shot in July and thinks she may have an ectopic pregnancy. Pt has multiple complaints.

## 2013-10-16 ENCOUNTER — Emergency Department (HOSPITAL_COMMUNITY)
Admission: EM | Admit: 2013-10-16 | Discharge: 2013-10-16 | Disposition: A | Discharge status: Home / Self Care | Payer: Self-pay | Attending: Emergency Medicine | Admitting: Emergency Medicine

## 2013-10-16 ENCOUNTER — Encounter (HOSPITAL_COMMUNITY): Payer: Self-pay | Admitting: Emergency Medicine

## 2013-10-16 DIAGNOSIS — Z792 Long term (current) use of antibiotics: Secondary | ICD-10-CM | POA: Insufficient documentation

## 2013-10-16 DIAGNOSIS — Z880 Allergy status to penicillin: Secondary | ICD-10-CM | POA: Insufficient documentation

## 2013-10-16 DIAGNOSIS — Z8659 Personal history of other mental and behavioral disorders: Secondary | ICD-10-CM | POA: Insufficient documentation

## 2013-10-16 DIAGNOSIS — I10 Essential (primary) hypertension: Secondary | ICD-10-CM | POA: Insufficient documentation

## 2013-10-16 DIAGNOSIS — J039 Acute tonsillitis, unspecified: Secondary | ICD-10-CM

## 2013-10-16 LAB — RAPID STREP SCREEN (MED CTR MEBANE ONLY): Streptococcus, Group A Screen (Direct): NEGATIVE

## 2013-10-16 MED ORDER — MAGIC MOUTHWASH W/LIDOCAINE: 10.0000 mL | Freq: Four times a day (QID) | ORAL | Status: DC | PRN

## 2013-10-16 MED ORDER — AZITHROMYCIN 250 MG PO TABS: 250.0000 mg | ORAL_TABLET | Freq: Every day | ORAL | Status: DC

## 2013-10-16 NOTE — Discharge Instructions (Signed)
Tonsillitis Tonsillitis is an infection of the throat that causes the tonsils to become red, tender, and swollen. Tonsils are collections of lymphoid tissue at the back of the throat. Each tonsil has crevices (crypts). Tonsils help fight nose and throat infections and keep infection from spreading to other parts of the body for the first 18 months of life.  CAUSES Sudden (acute) tonsillitis is usually caused by infection with streptococcal bacteria. Long-lasting (chronic) tonsillitis occurs when the crypts of the tonsils become filled with pieces of food and bacteria, which makes it easy for the tonsils to become repeatedly infected. SYMPTOMS  Symptoms of tonsillitis include:  A sore throat, with possible difficulty swallowing.  White patches on the tonsils.  Fever.  Tiredness.  New episodes of snoring during sleep, when you did not snore before.  Small, foul-smelling, yellowish-white pieces of material (tonsilloliths) that you occasionally cough up or spit out. The tonsilloliths can also cause you to have bad breath. DIAGNOSIS Tonsillitis can be diagnosed through a physical exam. Diagnosis can be confirmed with the results of lab tests, including a throat culture. TREATMENT  The goals of tonsillitis treatment include the reduction of the severity and duration of symptoms and prevention of associated conditions. Symptoms of tonsillitis can be improved with the use of steroids to reduce the swelling. Tonsillitis caused by bacteria can be treated with antibiotic medicines. Usually, treatment with antibiotic medicines is started before the cause of the tonsillitis is known. However, if it is determined that the cause is not bacterial, antibiotic medicines will not treat the tonsillitis. If attacks of tonsillitis are severe and frequent, your health care provider may recommend surgery to remove the tonsils (tonsillectomy). HOME CARE INSTRUCTIONS   Rest as much as possible and get plenty of  sleep.  Drink plenty of fluids. While the throat is very sore, eat soft foods or liquids, such as sherbet, soups, or instant breakfast drinks.  Eat frozen ice pops.  Gargle with a warm or cold liquid to help soothe the throat. Mix 1/4 teaspoon of salt and 1/4 teaspoon of baking soda in 8 oz of water. SEEK MEDICAL CARE IF:   Large, tender lumps develop in your neck.  A rash develops.  A green, yellow-brown, or bloody substance is coughed up.  You are unable to swallow liquids or food for 24 hours.  You notice that only one of the tonsils is swollen. SEEK IMMEDIATE MEDICAL CARE IF:   You develop any new symptoms such as vomiting, severe headache, stiff neck, chest pain, or trouble breathing or swallowing.  You have severe throat pain along with drooling or voice changes.  You have severe pain, unrelieved with recommended medications.  You are unable to fully open the mouth.  You develop redness, swelling, or severe pain anywhere in the neck.  You have a fever. MAKE SURE YOU:   Understand these instructions.  Will watch your condition.  Will get help right away if you are not doing well or get worse. Document Released: 10/06/2004 Document Revised: 05/13/2013 Document Reviewed: 06/15/2012 [facility] Patient Information 2015 [place]. This information is not intended to replace advice given to you by your health care provider. Make sure you discuss any questions you have with your health care provider.   Make sure you are drinking plenty of fluids.  As discussed, take your ibuprofen 600 mg which equals 3 over the counter tablets every 8 hours.  Make sure you take this with food.  Get rechecked if your symptoms are  not improving with this treatment.

## 2013-10-16 NOTE — ED Notes (Signed)
Onset yesterday, sore throat, aching all over, cough,  Clear and yellow nasal drainage

## 2013-10-17 NOTE — ED Provider Notes (Signed)
CSN: [id number]     Arrival date & time 10/16/13  1513 History   First MD Initiated Contact with Patient 10/16/13 1617     Chief Complaint  Patient presents with  . Sore Throat     (Consider location/radiation/quality/duration/timing/severity/associated sxs/prior Treatment) Patient is a 18 y.o. female presenting with pharyngitis. The history is provided by the patient.  Sore Throat This is a new problem. The current episode started yesterday. The problem occurs constantly. The problem has been gradually worsening. Associated symptoms include congestion, coughing, fatigue, myalgias and a sore throat. Pertinent negatives include no abdominal pain, chest pain, chills, fever, headaches, joint swelling, nausea, neck pain, numbness, rash, swollen glands, vomiting or weakness. The symptoms are aggravated by swallowing. She has tried acetaminophen for the symptoms. The treatment provided no relief.    Past Medical History  Diagnosis Date  . Hypertension   . Panic attacks    History reviewed. No pertinent past surgical history. History reviewed. No pertinent family history. History  Substance Use Topics  . Smoking status: Never Smoker   . Smokeless tobacco: Not on file  . Alcohol Use: No   OB History   Grav Para Term Preterm Abortions TAB SAB Ect Mult Living   0 0             Review of Systems  Constitutional: Positive for fatigue. Negative for fever and chills.  HENT: Positive for congestion and sore throat.   Eyes: Negative.   Respiratory: Positive for cough. Negative for chest tightness and shortness of breath.   Cardiovascular: Negative for chest pain.  Gastrointestinal: Negative for nausea, vomiting and abdominal pain.  Genitourinary: Negative.   Musculoskeletal: Positive for myalgias. Negative for joint swelling and neck pain.  Skin: Negative.  Negative for rash and wound.  Neurological: Negative for dizziness, weakness, light-headedness, numbness and headaches.   Psychiatric/Behavioral: Negative.       Allergies  Amoxicillin and Other  Home Medications   Prior to Admission medications   Medication Sig Start Date End Date Taking? Authorizing Provider  acetaminophen (TYLENOL) 500 MG tablet Take 1,000 mg by mouth every 6 (six) hours as needed.    Historical Provider, MD  Alum & Mag Hydroxide-Simeth (MAGIC MOUTHWASH W/LIDOCAINE) SOLN Take 10 mLs by mouth 4 (four) times daily as needed (throat pain). 10/16/13   Burgess Amor, PA-C  azithromycin (ZITHROMAX) 250 MG tablet Take 1 tablet (250 mg total) by mouth daily. Take first 2 tablets together, then 1 every day until finished. 10/16/13   Burgess Amor, PA-C  medroxyPROGESTERone (DEPO-PROVERA) 150 MG/ML injection Inject 150 mg into the muscle every 3 (three) months.    Historical Provider, MD  Multiple Vitamin (MULTIVITAMIN WITH MINERALS) TABS tablet Take 1 tablet by mouth daily.    Historical Provider, MD   BP 142/91  Pulse 101  Temp(Src) 98.5 F (36.9 C) (Oral)  Resp 18  Ht 5\' 5"  (1.651 m)  Wt 157 lb (71.215 kg)  BMI 26.13 kg/m2  SpO2 100%  LMP 09/30/2013 Physical Exam  Constitutional: She is oriented to person, place, and time. She appears well-developed and well-nourished.  HENT:  Head: Normocephalic and atraumatic.  Right Ear: Tympanic membrane and ear canal normal.  Left Ear: Tympanic membrane and ear canal normal.  Nose: Mucosal edema and rhinorrhea present.  Mouth/Throat: Uvula is midline and mucous membranes are normal. Posterior oropharyngeal edema and posterior oropharyngeal erythema present. No oropharyngeal exudate or tonsillar abscesses.  Mild posterior pharyngeal erythema, no exudate, tonsils are hypertrophied, nearly touching  uvula equally, uvula midline.  Eyes: Conjunctivae are normal.  Neck: Normal range of motion present.  Cardiovascular: Normal rate and normal heart sounds.   Pulmonary/Chest: Effort normal. No stridor. No respiratory distress. She has no wheezes. She has no  rales.  Abdominal: Soft. There is no tenderness.  Musculoskeletal: Normal range of motion.  Lymphadenopathy:       Head (right side): Tonsillar adenopathy present.       Head (left side): Tonsillar adenopathy present.  Neurological: She is alert and oriented to person, place, and time.  Skin: Skin is warm and dry. No rash noted.  Psychiatric: She has a normal mood and affect.    ED Course  Procedures (including critical care time) Labs Review Labs Reviewed  RAPID STREP SCREEN  CULTURE, GROUP A STREP    Imaging Review No results found.   EKG Interpretation None      MDM   Final diagnoses:  Acute tonsillitis    Pt placed on zithromax, given script also for magic mouthwash.  Continue ibuprofen, rest, increased fluid intake.  F/u with pcp prn.  The patient appears reasonably screened and/or stabilized for discharge and I doubt any other medical condition or other [facility] requiring further screening, evaluation, or treatment in the ED at this time prior to discharge.      Burgess AmorJulie Alis [place], PA-C 10/17/13 1414

## 2013-10-17 NOTE — ED Provider Notes (Signed)
Medical screening examination/treatment/procedure(s) were performed by non-physician practitioner and as supervising physician I was immediately available for consultation/collaboration.   EKG Interpretation None        Benny LennertJoseph L Eusebia Grulke, MD 10/17/13 1540

## 2013-10-19 LAB — CULTURE, GROUP A STREP

## 2014-01-23 ENCOUNTER — Emergency Department (HOSPITAL_COMMUNITY)
Admission: EM | Admit: 2014-01-23 | Discharge: 2014-01-23 | Disposition: A | Discharge status: Home / Self Care | Payer: Self-pay | Attending: Emergency Medicine | Admitting: Emergency Medicine

## 2014-01-23 ENCOUNTER — Encounter (HOSPITAL_COMMUNITY): Payer: Self-pay | Admitting: Emergency Medicine

## 2014-01-23 DIAGNOSIS — M791 Myalgia: Secondary | ICD-10-CM | POA: Insufficient documentation

## 2014-01-23 DIAGNOSIS — Z792 Long term (current) use of antibiotics: Secondary | ICD-10-CM | POA: Insufficient documentation

## 2014-01-23 DIAGNOSIS — I10 Essential (primary) hypertension: Secondary | ICD-10-CM | POA: Insufficient documentation

## 2014-01-23 DIAGNOSIS — B349 Viral infection, unspecified: Secondary | ICD-10-CM | POA: Insufficient documentation

## 2014-01-23 DIAGNOSIS — Z8659 Personal history of other mental and behavioral disorders: Secondary | ICD-10-CM | POA: Insufficient documentation

## 2014-01-23 LAB — RAPID STREP SCREEN (MED CTR MEBANE ONLY): Streptococcus, Group A Screen (Direct): NEGATIVE

## 2014-01-23 NOTE — Care Management Note (Signed)
ED/CM noted patient did not have health insurance and/or PCP listed in the computer.  Patient was given the [place] resource handout with information on the clinics, food pantries, and the handout for new health insurance sign-up.  Patient expressed appreciation for information received. 

## 2014-01-23 NOTE — ED Provider Notes (Signed)
CSN: [id number]     Arrival date & time 01/23/14  4098 History  This chart was scribed for Benny Lennert, MD by Ronney Lion, ED Scribe. This patient was seen in room APA06/APA06 and the patient's care was started at 9:36 AM.    Chief Complaint  Patient presents with  . Sore Throat   Patient is a 19 y.o. female presenting with pharyngitis. The history is provided by the patient. No language interpreter was used.  Sore Throat This is a new problem. The current episode started more than 2 days ago. The problem occurs constantly. The problem has been gradually worsening. Pertinent negatives include no chest pain, no abdominal pain and no headaches. Nothing aggravates the symptoms. Nothing relieves the symptoms. She has tried nothing for the symptoms.     HPI Comments: GRAYLEE ARUTYUNYAN is a 19 y.o. female who presents to the Emergency Department complaining of severe sore throat accompanied by painful swallowing and a "lost voice," onset 3 days ago. She complains of associated body aches, fever, and dry cough. She denies having had a flu shot this year.    Past Medical History  Diagnosis Date  . Hypertension   . Panic attacks    History reviewed. No pertinent past surgical history. History reviewed. No pertinent family history. History  Substance Use Topics  . Smoking status: Never Smoker   . Smokeless tobacco: Not on file  . Alcohol Use: No   OB History    Gravida Para Term Preterm AB TAB SAB Ectopic Multiple Living   0 0             Review of Systems  Constitutional: Positive for fever. Negative for appetite change.  HENT: Positive for sore throat. Negative for congestion, ear discharge and sinus pressure.   Eyes: Negative for discharge.  Respiratory: Positive for cough.   Cardiovascular: Negative for chest pain.  Gastrointestinal: Negative for abdominal pain and diarrhea.  Genitourinary: Negative for frequency and hematuria.  Musculoskeletal: Positive for myalgias. Negative for  back pain.  Skin: Negative for rash.  Neurological: Negative for seizures and headaches.  Psychiatric/Behavioral: Negative for hallucinations.      Allergies  Amoxicillin and Other  Home Medications   Prior to Admission medications   Medication Sig Start Date End Date Taking? Authorizing Provider  acetaminophen (TYLENOL) 500 MG tablet Take 1,000 mg by mouth every 6 (six) hours as needed.    Historical Provider, MD  Alum & Mag Hydroxide-Simeth (MAGIC MOUTHWASH W/LIDOCAINE) SOLN Take 10 mLs by mouth 4 (four) times daily as needed (throat pain). 10/16/13   Burgess Amor, PA-C  azithromycin (ZITHROMAX) 250 MG tablet Take 1 tablet (250 mg total) by mouth daily. Take first 2 tablets together, then 1 every day until finished. 10/16/13   Burgess Amor, PA-C  medroxyPROGESTERone (DEPO-PROVERA) 150 MG/ML injection Inject 150 mg into the muscle every 3 (three) months.    Historical Provider, MD  Multiple Vitamin (MULTIVITAMIN WITH MINERALS) TABS tablet Take 1 tablet by mouth daily.    Historical Provider, MD   BP 145/93 mmHg  Pulse 107  Temp(Src) 98.8 F (37.1 C) (Oral)  Resp 18  Ht  (1.626 m)  Wt 160 lb (72.576 kg)  BMI 27.45 kg/m2  SpO2 100%  LMP 11/23/2013 Physical Exam  Constitutional: She is oriented to person, place, and time. She appears well-developed.  HENT:  Head: Normocephalic.  MIldly inflamed pharynx.  Eyes: Conjunctivae and EOM are normal. No scleral icterus.  Neck: Neck supple.  No thyromegaly present.  Cardiovascular: Normal rate and regular rhythm.  Exam reveals no gallop and no friction rub.   No murmur heard. Pulmonary/Chest: No stridor. She has no wheezes. She has no rales. She exhibits no tenderness.  Abdominal: She exhibits no distension. There is no tenderness. There is no rebound.  Musculoskeletal: Normal range of motion. She exhibits no edema.  Lymphadenopathy:    She has no cervical adenopathy.  Neurological: She is oriented to person, place, and time. She  exhibits normal muscle tone. Coordination normal.  Skin: No rash noted. No erythema.  Psychiatric: She has a normal mood and affect. Her behavior is normal.  Nursing note and vitals reviewed.   ED Course  Procedures (including critical care time)  DIAGNOSTIC STUDIES: Oxygen Saturation is 100% on room air, normal by my interpretation.    COORDINATION OF CARE: 9:38 AM - Discussed treatment plan with pt at bedside which includes strep screen and pt agreed to plan.   Labs Review Labs Reviewed - No data to display  Imaging Review No results found.   EKG Interpretation None      MDM   Final diagnoses:  None    Viral syndrome,   Motrin   The chart was scribed for me under my direct supervision.  I personally performed the history, physical, and medical decision making and all procedures in the evaluation of this patient.Benny Lennert.     Kateleen Encarnacion L Daneen Volcy, MD 01/23/14 1116

## 2014-01-23 NOTE — ED Notes (Signed)
Pt reports cough,sore throat, intermittent fever and body aches x3 days. nad noted. Airway patent.

## 2014-01-23 NOTE — Discharge Instructions (Signed)
Drink plenty of fluids.   Take 600mg  of liquid motrin or ibuprofen every 6 hours for aches and fever

## 2014-01-25 LAB — CULTURE, GROUP A STREP

## 2014-09-09 ENCOUNTER — Emergency Department (HOSPITAL_COMMUNITY): Payer: No Typology Code available for payment source

## 2014-09-09 ENCOUNTER — Emergency Department (HOSPITAL_COMMUNITY)
Admission: EM | Admit: 2014-09-09 | Discharge: 2014-09-09 | Disposition: A | Discharge status: Home / Self Care | Payer: No Typology Code available for payment source | Attending: Emergency Medicine | Admitting: Emergency Medicine

## 2014-09-09 ENCOUNTER — Encounter (HOSPITAL_COMMUNITY): Payer: Self-pay

## 2014-09-09 DIAGNOSIS — Y998 Other external cause status: Secondary | ICD-10-CM | POA: Insufficient documentation

## 2014-09-09 DIAGNOSIS — Z8659 Personal history of other mental and behavioral disorders: Secondary | ICD-10-CM | POA: Insufficient documentation

## 2014-09-09 DIAGNOSIS — Y9389 Activity, other specified: Secondary | ICD-10-CM | POA: Insufficient documentation

## 2014-09-09 DIAGNOSIS — S4991XA Unspecified injury of right shoulder and upper arm, initial encounter: Secondary | ICD-10-CM | POA: Insufficient documentation

## 2014-09-09 DIAGNOSIS — I10 Essential (primary) hypertension: Secondary | ICD-10-CM | POA: Diagnosis not present

## 2014-09-09 DIAGNOSIS — Z880 Allergy status to penicillin: Secondary | ICD-10-CM | POA: Insufficient documentation

## 2014-09-09 DIAGNOSIS — Y92410 Unspecified street and highway as the place of occurrence of the external cause: Secondary | ICD-10-CM | POA: Insufficient documentation

## 2014-09-09 DIAGNOSIS — S161XXA Strain of muscle, fascia and tendon at neck level, initial encounter: Secondary | ICD-10-CM | POA: Diagnosis not present

## 2014-09-09 DIAGNOSIS — S199XXA Unspecified injury of neck, initial encounter: Secondary | ICD-10-CM | POA: Diagnosis present

## 2014-09-09 DIAGNOSIS — Z793 Long term (current) use of hormonal contraceptives: Secondary | ICD-10-CM | POA: Insufficient documentation

## 2014-09-09 MED ORDER — METHOCARBAMOL 500 MG PO TABS: 500.0000 mg | ORAL_TABLET | Freq: Three times a day (TID) | ORAL | Status: DC

## 2014-09-09 MED ORDER — METHOCARBAMOL 500 MG PO TABS
500.0000 mg | ORAL_TABLET | Freq: Once | ORAL | Status: AC
  Administered 2014-09-09: 500 mg via ORAL
  Filled 2014-09-09: qty 1

## 2014-09-09 MED ORDER — IBUPROFEN 800 MG PO TABS
800.0000 mg | ORAL_TABLET | Freq: Once | ORAL | Status: AC
  Administered 2014-09-09: 800 mg via ORAL
  Filled 2014-09-09: qty 1

## 2014-09-09 MED ORDER — IBUPROFEN 800 MG PO TABS: 800.0000 mg | ORAL_TABLET | Freq: Three times a day (TID) | ORAL | Status: DC

## 2014-09-09 NOTE — Discharge Instructions (Signed)
Cervical Sprain A cervical sprain is when the tissues (ligaments) that hold the neck bones in place stretch or tear. HOME CARE   Put ice on the injured area.  Put ice in a plastic bag.  Place a towel between your skin and the bag.  Leave the ice on for 15-20 minutes, 3-4 times a day.  You may have been given a collar to wear. This collar keeps your neck from moving while you heal.  Do not take the collar off unless told by your doctor.  If you have long hair, keep it outside of the collar.  Ask your doctor before changing the position of your collar. You may need to change its position over time to make it more comfortable.  If you are allowed to take off the collar for cleaning or bathing, follow your doctor's instructions on how to do it safely.  Keep your collar clean by wiping it with mild soap and water. Dry it completely. If the collar has removable pads, remove them every 1-2 days to hand wash them with soap and water. Allow them to air dry. They should be dry before you wear them in the collar.  Do not drive while wearing the collar.  Only take medicine as told by your doctor.  Keep all doctor visits as told.  Keep all physical therapy visits as told.  Adjust your work station so that you have good posture while you work.  Avoid positions and activities that make your problems worse.  Warm up and stretch before being active. GET HELP IF:  Your pain is not controlled with medicine.  You cannot take less pain medicine over time as planned.  Your activity level does not improve as expected. GET HELP RIGHT AWAY IF:   You are bleeding.  Your stomach is upset.  You have an allergic reaction to your medicine.  You develop new problems that you cannot explain.  You lose feeling (become numb) or you cannot move any part of your body (paralysis).  You have tingling or weakness in any part of your body.  Your symptoms get worse. Symptoms include:  Pain,  soreness, stiffness, puffiness (swelling), or a burning feeling in your neck.  Pain when your neck is touched.  Shoulder or upper back pain.  Limited ability to move your neck.  Headache.  Dizziness.  Your hands or arms feel week, lose feeling, or tingle.  Muscle spasms.  Difficulty swallowing or chewing. MAKE SURE YOU:   Understand these instructions.  Will watch your condition.  Will get help right away if you are not doing well or get worse. Document Released: 06/15/2007 Document Revised: 08/29/2012 Document Reviewed: 07/04/2012 Exit[employer] Patient Information 2015 [employer]. This information is not intended to replace advice given to you by your health care provider. Make sure you discuss any questions you have with your health care provider.  
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
## 2015-01-21 ENCOUNTER — Encounter: Payer: Self-pay | Admitting: Obstetrics & Gynecology

## 2015-01-26 ENCOUNTER — Encounter (HOSPITAL_COMMUNITY): Payer: Self-pay | Admitting: *Deleted

## 2015-01-26 ENCOUNTER — Emergency Department (HOSPITAL_COMMUNITY)
Admission: EM | Admit: 2015-01-26 | Discharge: 2015-01-26 | Disposition: A | Discharge status: Home / Self Care | Payer: 59 | Attending: Emergency Medicine | Admitting: Emergency Medicine

## 2015-01-26 DIAGNOSIS — Z8659 Personal history of other mental and behavioral disorders: Secondary | ICD-10-CM | POA: Diagnosis not present

## 2015-01-26 DIAGNOSIS — Z791 Long term (current) use of non-steroidal anti-inflammatories (NSAID): Secondary | ICD-10-CM | POA: Diagnosis not present

## 2015-01-26 DIAGNOSIS — Z880 Allergy status to penicillin: Secondary | ICD-10-CM | POA: Diagnosis not present

## 2015-01-26 DIAGNOSIS — Z793 Long term (current) use of hormonal contraceptives: Secondary | ICD-10-CM | POA: Diagnosis not present

## 2015-01-26 DIAGNOSIS — I10 Essential (primary) hypertension: Secondary | ICD-10-CM | POA: Insufficient documentation

## 2015-01-26 DIAGNOSIS — B9789 Other viral agents as the cause of diseases classified elsewhere: Secondary | ICD-10-CM | POA: Diagnosis not present

## 2015-01-26 DIAGNOSIS — R21 Rash and other nonspecific skin eruption: Secondary | ICD-10-CM | POA: Diagnosis not present

## 2015-01-26 DIAGNOSIS — B09 Unspecified viral infection characterized by skin and mucous membrane lesions: Secondary | ICD-10-CM

## 2015-01-26 DIAGNOSIS — Z79899 Other long term (current) drug therapy: Secondary | ICD-10-CM | POA: Diagnosis not present

## 2015-01-26 LAB — RAPID STREP SCREEN (MED CTR MEBANE ONLY): Streptococcus, Group A Screen (Direct): NEGATIVE

## 2015-01-26 NOTE — ED Notes (Signed)
Pt made aware to return if symptoms worsen or if any life threatening symptoms occur.  Pt made aware to follow a low salt diet.

## 2015-01-26 NOTE — ED Provider Notes (Signed)
CSN: [id number]     Arrival date & time 01/26/15  1129 History  By signing my name below, I, Emmanuella Mensah, attest that this documentation has been prepared under the direction and in the presence of Cheron SchaumannLeslie Tykee Heideman, PA-C. Electronically Signed: Angelene GiovanniEmmanuella Mensah, ED Scribe. 01/26/2015. 1:30 PM.    Chief Complaint  Patient presents with  . Rash   The history is provided by the patient. No language interpreter was used.   HPI Comments: Elaine Hernandez is a 20 y.o. female who presents to the Emergency Department complaining of gradually worsening generalized spreading rash onset 3 days ago. She explains that she had a rash the week of 01/04/15 and received Prednisone and Antibiotics at Urgent Care for the rash and a sore throat at that time. She states that she took 5 days of the 10 day antibiotics because she was diagnosed with a light strep throat. She adds that the rash started in between her breast. Pt was seen last night where she had steroids but states that the rash still persists. She denies any new detergents or lotions. No fever or n/v.    Past Medical History  Diagnosis Date  . Hypertension   . Panic attacks    History reviewed. No pertinent past surgical history. No family history on file. Social History  Substance Use Topics  . Smoking status: Never Smoker   . Smokeless tobacco: None  . Alcohol Use: No   OB History    Gravida Para Term Preterm AB TAB SAB Ectopic Multiple Living   0 0             Review of Systems  Constitutional: Negative for fever.  Gastrointestinal: Negative for nausea and vomiting.  Skin: Positive for rash.  All other systems reviewed and are negative.     Allergies  Amoxicillin and Other  Home Medications   Prior to Admission medications   Medication Sig Start Date End Date Taking? Authorizing Provider  ibuprofen (ADVIL,MOTRIN) 800 MG tablet Take 1 tablet (800 mg total) by mouth 3 (three) times daily. 09/09/14   [name], PA-C   levonorgestrel-ethinyl estradiol (AVIANE,ALESSE,LESSINA) 0.1-20 MG-MCG tablet Take 1 tablet by mouth daily.    Historical Provider, MD  methocarbamol (ROBAXIN) 500 MG tablet Take 1 tablet (500 mg total) by mouth 3 (three) times daily. 09/09/14   [name], PA-C  Phenylephrine-Pheniramine-DM (THERAFLU COLD & COUGH PO) Take 30 mLs by mouth once.    Historical Provider, MD   BP 166/96 mmHg  Pulse 87  Temp(Src) 98.6 F (37 C) (Oral)  Resp 16  Ht 5\' 4"  (1.626 m)  Wt 170 lb (77.111 kg)  BMI 29.17 kg/m2  SpO2 100% Physical Exam  Constitutional: She is oriented to person, place, and time. She appears well-developed and well-nourished. No distress.  HENT:  Head: Normocephalic and atraumatic.  Eyes: Conjunctivae and EOM are normal.  Neck: Neck supple. No tracheal deviation present.  Cardiovascular: Normal rate.   Pulmonary/Chest: Effort normal. No respiratory distress.  Musculoskeletal: Normal range of motion.  Neurological: She is alert and oriented to person, place, and time.  Skin: Skin is warm and dry. Rash noted. There is erythema.  Diffuse red raised rash to full body  Psychiatric: She has a normal mood and affect. Her behavior is normal.  Nursing note and vitals reviewed.   ED Course  Procedures (including critical care time) DIAGNOSTIC STUDIES: Oxygen Saturation is 100% on RA, normal by my interpretation.    COORDINATION OF CARE: 1:28  PM- Pt advised of plan for treatment and pt agrees. Pt will receive a rapid strep screen.    Labs Review Labs Reviewed  RAPID STREP SCREEN (NOT AT [facility])  CULTURE, GROUP A STREP [facility])     Cheron Schaumann, PA-C has personally reviewed and evaluated these lab results as part of her medical decision-making.  MDM   Final diagnoses:  Viral rash    I personally performed the services in this documentation, which was scribed in my presence.  The recorded information has been reviewed and considered.   Barnet Pall.  Lonia Skinner Bruning,  PA-C 01/26/15 1445  Raeford Razor, MD 02/04/15 [id number]

## 2015-01-26 NOTE — Discharge Instructions (Signed)

## 2015-01-26 NOTE — ED Notes (Signed)
Pt states she began having rash with blisters all over body starting 2 weeks ago. Pt went to urgent care and was told she had strep and treated. Pt has still had a rash since then. Denies any new use of detergent or body sprays. Pt states she is allergic to citrus fruits and noticed rash after drinking that. She went to hospital yesterday and was given steroids but states her itching and redness is no better.

## 2015-01-29 LAB — CULTURE, GROUP A STREP (THRC)

## 2015-02-09 ENCOUNTER — Encounter: Payer: Self-pay | Admitting: Obstetrics & Gynecology

## 2015-02-26 ENCOUNTER — Encounter: Payer: Self-pay | Admitting: Obstetrics & Gynecology

## 2015-05-06 ENCOUNTER — Emergency Department (HOSPITAL_COMMUNITY): Payer: 59

## 2015-05-06 ENCOUNTER — Encounter (HOSPITAL_COMMUNITY): Payer: Self-pay | Admitting: *Deleted

## 2015-05-06 ENCOUNTER — Emergency Department (HOSPITAL_COMMUNITY)
Admission: EM | Admit: 2015-05-06 | Discharge: 2015-05-06 | Disposition: A | Discharge status: Home / Self Care | Payer: 59 | Attending: Emergency Medicine | Admitting: Emergency Medicine

## 2015-05-06 DIAGNOSIS — I10 Essential (primary) hypertension: Secondary | ICD-10-CM | POA: Insufficient documentation

## 2015-05-06 DIAGNOSIS — M25562 Pain in left knee: Secondary | ICD-10-CM | POA: Diagnosis not present

## 2015-05-06 NOTE — ED Notes (Addendum)
Patient reports falling down stairs about a week ago. States left knee was swelling but with ice and rest got better. Now knee is swelling again and pain is increasing.

## 2015-05-06 NOTE — ED Provider Notes (Signed)
CSN: [id number]     Arrival date & time 05/06/15  [id number] History  By signing my name below, I, Elaine Hernandez, attest that this documentation has been prepared under the direction and in the presence of Blane OharaJoshua Cosme Jacob, MD. Electronically Signed: Marica OtterNusrat Hernandez, ED Scribe. 05/06/2015. 10:32 AM.  Chief Complaint  Patient presents with  . Knee Pain   The history is provided by the patient. No language interpreter was used.   PCP: Louie BostonAPPER,DAVID B, MD HPI Comments: Elaine Hernandez is a 20 y.o. female who presents to the Emergency Department complaining of traumatic, acute, intermittent, worsening left knee pain with associated swelling onset one week ago following a mechanical fall down some steps. Pt reports icing and resting the affected area with some initial relief, however, Sx worsened last night.  Past Medical History  Diagnosis Date  . Hypertension   . Panic attacks    History reviewed. No pertinent past surgical history. History reviewed. No pertinent family history. Social History  Substance Use Topics  . Smoking status: Never Smoker   . Smokeless tobacco: None  . Alcohol Use: No   OB History    Gravida Para Term Preterm AB TAB SAB Ectopic Multiple Living   0 0             Review of Systems  Musculoskeletal: Positive for joint swelling (left knee) and arthralgias (left knee).  All other systems reviewed and are negative.  Allergies  Amoxicillin; Cefdinir; and Other  Home Medications   Prior to Admission medications   Medication Sig Start Date End Date Taking? Authorizing Provider  Etonogestrel (NEXPLANON [place]) Inject 1 each into the skin once.    Historical Provider, MD  ibuprofen (ADVIL,MOTRIN) 800 MG tablet Take 1 tablet (800 mg total) by mouth 3 (three) times daily. Patient not taking: Reported on 05/06/2015 09/09/14   [name], PA-C  methocarbamol (ROBAXIN) 500 MG tablet Take 1 tablet (500 mg total) by mouth 3 (three) times daily. Patient not taking: Reported on  05/06/2015 09/09/14   [name], PA-C   Triage Vitals: BP 146/96 mmHg  Pulse 97  Temp(Src) 98 F (36.7 C) (Oral)  Resp 18  Ht 5\' 4"  (1.626 m)  Wt 170 lb (77.111 kg)  BMI 29.17 kg/m2  SpO2 99% Physical Exam  Constitutional: She is oriented to person, place, and time. She appears well-developed and well-nourished.  HENT:  Head: Normocephalic.  Eyes: EOM are normal.  Neck: Normal range of motion.  Pulmonary/Chest: Effort normal.  Abdominal: She exhibits no distension.  Musculoskeletal: Normal range of motion. She exhibits tenderness.  Tenderness to lower, anterior patella. Mild tenderness to the left tendon infra patella region. No significant tenderness to lateral or medial left knee. No ligament laxity with varus and valgus stress. Good ROM of left knee. No significant effusion.   Neurological: She is alert and oriented to person, place, and time.  Psychiatric: She has a normal mood and affect.  Nursing note and vitals reviewed.   ED Course  Procedures (including critical care time) DIAGNOSTIC STUDIES: Oxygen Saturation is 99% on ra, nl by my interpretation.    COORDINATION OF CARE: 10:30 AM: Discussed treatment plan which includes imaging results, ortho referral, RICE, meds, and work note with pt at bedside; patient verbalizes understanding and agrees with treatment plan.  Imaging Review Dg Knee Complete 4 Views Left  05/06/2015  CLINICAL DATA:  Tripped 1 week ago.  Left knee pain. EXAM: LEFT KNEE - COMPLETE 4+ VIEW COMPARISON:  None.  FINDINGS: There is no evidence of fracture, dislocation, or joint effusion. There is no evidence of arthropathy or other focal bone abnormality. Soft tissues are unremarkable. IMPRESSION: Negative. Electronically Signed   By: [name] M.D.   On: 05/06/2015 09:55   I have personally reviewed and evaluated these images as part of my medical decision-making.  MDM   Final diagnoses:  Knee pain, acute, left   Well-appearing patient low  risk injury. X-ray reviewed with patient no fracture. Discussed outpatient follow-up and supportive care  Results and differential diagnosis were discussed with the patient/parent/guardian. Xrays were independently reviewed by myself.  Close follow up outpatient was discussed, comfortable with the plan.   Medications - No data to display  Filed Vitals:   05/06/15 0935  BP: 146/96  Pulse: 97  Temp: 98 F (36.7 C)  TempSrc: Oral  Resp: 18  Height:  (1.626 m)  Weight: 170 lb (77.111 kg)  SpO2: 99%    Final diagnoses:  Knee pain, acute, left      Blane Ohara, MD 05/06/15 1103

## 2015-05-06 NOTE — Discharge Instructions (Signed)
Use Tylenol, ibuprofen and ice as needed. If no improvement follow-up with orthopedic specialist.  If you were given medicines take as directed.  If you are on coumadin or contraceptives realize their levels and effectiveness is altered by many different medicines.  If you have any reaction (rash, tongues swelling, other) to the medicines stop taking and see a physician.    If your blood pressure was elevated in the ER make sure you follow up for management with a primary doctor or return for chest pain, shortness of breath or stroke symptoms.  Please follow up as directed and return to the ER or see a physician for new or worsening symptoms.  Thank you. Filed Vitals:   05/06/15 0935  BP: 146/96  Pulse: 97  Temp: 98 F (36.7 C)  TempSrc: Oral  Resp: 18  Height: 5\' 4"  (1.626 m)  Weight: 170 lb (77.111 kg)  SpO2: 99%

## 2015-08-21 ENCOUNTER — Emergency Department (HOSPITAL_COMMUNITY): Payer: 59

## 2015-08-21 ENCOUNTER — Emergency Department (HOSPITAL_COMMUNITY)
Admission: EM | Admit: 2015-08-21 | Discharge: 2015-08-21 | Disposition: A | Discharge status: Home / Self Care | Payer: 59 | Attending: Emergency Medicine | Admitting: Emergency Medicine

## 2015-08-21 ENCOUNTER — Encounter (HOSPITAL_COMMUNITY): Payer: Self-pay | Admitting: *Deleted

## 2015-08-21 DIAGNOSIS — Y999 Unspecified external cause status: Secondary | ICD-10-CM | POA: Diagnosis not present

## 2015-08-21 DIAGNOSIS — R109 Unspecified abdominal pain: Secondary | ICD-10-CM | POA: Diagnosis not present

## 2015-08-21 DIAGNOSIS — S20312A Abrasion of left front wall of thorax, initial encounter: Secondary | ICD-10-CM | POA: Insufficient documentation

## 2015-08-21 DIAGNOSIS — Y92410 Unspecified street and highway as the place of occurrence of the external cause: Secondary | ICD-10-CM | POA: Diagnosis not present

## 2015-08-21 DIAGNOSIS — Y9389 Activity, other specified: Secondary | ICD-10-CM | POA: Insufficient documentation

## 2015-08-21 DIAGNOSIS — R079 Chest pain, unspecified: Secondary | ICD-10-CM | POA: Diagnosis not present

## 2015-08-21 DIAGNOSIS — R51 Headache: Secondary | ICD-10-CM | POA: Diagnosis not present

## 2015-08-21 DIAGNOSIS — S60552A Superficial foreign body of left hand, initial encounter: Secondary | ICD-10-CM | POA: Diagnosis not present

## 2015-08-21 DIAGNOSIS — S6992XA Unspecified injury of left wrist, hand and finger(s), initial encounter: Secondary | ICD-10-CM | POA: Diagnosis present

## 2015-08-21 DIAGNOSIS — I10 Essential (primary) hypertension: Secondary | ICD-10-CM | POA: Diagnosis not present

## 2015-08-21 DIAGNOSIS — S20311A Abrasion of right front wall of thorax, initial encounter: Secondary | ICD-10-CM | POA: Insufficient documentation

## 2015-08-21 DIAGNOSIS — S61412A Laceration without foreign body of left hand, initial encounter: Secondary | ICD-10-CM | POA: Diagnosis not present

## 2015-08-21 DIAGNOSIS — Z23 Encounter for immunization: Secondary | ICD-10-CM | POA: Insufficient documentation

## 2015-08-21 DIAGNOSIS — T07XXXA Unspecified multiple injuries, initial encounter: Secondary | ICD-10-CM

## 2015-08-21 LAB — I-STAT CHEM 8, ED
BUN: 9 mg/dL (ref 6–20)
CHLORIDE: 103 mmol/L (ref 101–111)
Calcium, Ion: 1.2 mmol/L (ref 1.13–1.30)
Creatinine, Ser: 0.5 mg/dL (ref 0.44–1.00)
Glucose, Bld: 89 mg/dL (ref 65–99)
HEMATOCRIT: 46 % (ref 36.0–46.0)
HEMOGLOBIN: 15.6 g/dL — AB (ref 12.0–15.0)
POTASSIUM: 3.6 mmol/L (ref 3.5–5.1)
SODIUM: 142 mmol/L (ref 135–145)
TCO2: 24 mmol/L (ref 0–100)

## 2015-08-21 LAB — I-STAT BETA HCG BLOOD, ED (MC, WL, AP ONLY)

## 2015-08-21 MED ORDER — DIAZEPAM 5 MG PO TABS: 5.0000 mg | ORAL_TABLET | Freq: Three times a day (TID) | ORAL | 0 refills | Status: DC

## 2015-08-21 MED ORDER — IOPAMIDOL (ISOVUE-300) INJECTION 61%
100.0000 mL | Freq: Once | INTRAVENOUS | Status: AC | PRN
  Administered 2015-08-21: 100 mL via INTRAVENOUS

## 2015-08-21 MED ORDER — SODIUM CHLORIDE 0.9 % IV BOLUS (SEPSIS)
500.0000 mL | Freq: Once | INTRAVENOUS | Status: AC
  Administered 2015-08-21: 500 mL via INTRAVENOUS

## 2015-08-21 MED ORDER — SODIUM CHLORIDE 0.9 % IV SOLN: INTRAVENOUS | Status: DC

## 2015-08-21 MED ORDER — HYDROCODONE-ACETAMINOPHEN 7.5-325 MG PO TABS: 1.0000 | ORAL_TABLET | ORAL | 0 refills | Status: DC | PRN

## 2015-08-21 MED ORDER — TETANUS-DIPHTH-ACELL PERTUSSIS 5-2.5-18.5 LF-MCG/0.5 IM SUSP
0.5000 mL | Freq: Once | INTRAMUSCULAR | Status: AC
  Administered 2015-08-21: 0.5 mL via INTRAMUSCULAR
  Filled 2015-08-21: qty 0.5

## 2015-08-21 MED ORDER — DOXYCYCLINE HYCLATE 100 MG PO CAPS: 100.0000 mg | ORAL_CAPSULE | Freq: Two times a day (BID) | ORAL | 0 refills | Status: DC

## 2015-08-21 MED ORDER — BUPIVACAINE HCL (PF) 0.25 % IJ SOLN
20.0000 mL | Freq: Once | INTRAMUSCULAR | Status: DC
  Filled 2015-08-21: qty 30

## 2015-08-21 MED ORDER — POVIDONE-IODINE 10 % EX SOLN
CUTANEOUS | Status: AC
  Filled 2015-08-21: qty 118

## 2015-08-21 NOTE — ED Notes (Signed)
Pt wound cleaned and wet to dry dressing applied by daphyne anderson, rn.

## 2015-08-21 NOTE — ED Notes (Signed)
Pt returned from ct. Nad.  

## 2015-08-21 NOTE — ED Notes (Signed)
Pt taken to ct 
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
## 2015-08-21 NOTE — ED Notes (Signed)
Pt taken to xray 

## 2015-08-21 NOTE — ED Notes (Signed)
Received pt from [facility] room five by R. Cockram, RN.  Pt has heploc to right forearm saline lock.  Elaine Hernandez. Bryant to remove three pieces of glass from pt's hand and be discharge.  Co left hand pain 10/10, headache 7/10, Jaw pain 8/10 and left shoulder pain 4/10.

## 2015-08-21 NOTE — Discharge Instructions (Signed)
Your x-rays are negative for fracture or dislocation or acute issues. There is possibly to pieces of glass still in your left hand. Please call Dr. Janee Mornhompson, and make an appointment in his office. Please use doxycycline 2 times daily with food. Please use Valium 3 times daily for muscle strain. Use Tylenol or ibuprofen for mild pain, use Norco for more severe pain. Norco and Valium may cause drowsiness, please do not drive, operate machinery, or participate in activities requiring concentration when taking these medications. Please return to the emergency department if any emergent changes, problems, or concerns.

## 2015-08-21 NOTE — ED Provider Notes (Signed)
REMOVAL FOREIGN BODY LEFT HAND. The procedure was explained to the patient in terms which she understands. The patient gives permission for removal of foreign body of the left hand.  Patient identified by arm band. Procedural time out taken.  The left hand was painted with Betadine. The affected area was infiltrated with 0.25% Sensorcaine. The wound was probed, and a piece of glass was removed. The x-ray showed 3 foreign bodies present, the other 21 able to BE  removed at this time. After the procedure the patient has good range of motion of the fingers on. There is good capillary refill present. A sterile bandage was applied. Patient will be started on antibiotics and referred to hand specialist for additional evaluation. Patient tolerated the procedure without problem.   Ivery QualeHobson Marguerette Sheller, PA-C 08/21/15 1752    Ivery QualeHobson Sheria Rosello, PA-C 08/21/15 1752    Cathren LaineKevin Steinl, MD 08/22/15 [phone]

## 2015-09-18 ENCOUNTER — Encounter: Payer: Self-pay | Admitting: Orthopedic Surgery

## 2015-09-18 ENCOUNTER — Ambulatory Visit (INDEPENDENT_AMBULATORY_CARE_PROVIDER_SITE_OTHER): Payer: 59 | Admitting: Orthopedic Surgery

## 2015-09-18 VITALS — BP 144/95 | HR 97 | Ht 64.5 in | Wt 177.0 lb

## 2015-09-18 DIAGNOSIS — S60552A Superficial foreign body of left hand, initial encounter: Secondary | ICD-10-CM | POA: Diagnosis not present

## 2015-09-18 NOTE — Patient Instructions (Signed)
SURGERY 10/01/15 FOREIGN BODY REMOVAL LEFT HAND
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
## 2015-09-21 ENCOUNTER — Other Ambulatory Visit: Payer: Self-pay | Admitting: *Deleted

## 2015-09-24 NOTE — Patient Instructions (Addendum)
    Elaine Hernandez  09/24/2015     @PREFPERIOPPHARMACY @   Your procedure is scheduled on 09/30/2015.  [facility]  Call this number if you have problems the morning of surgery:  [phone]   Remember:  Do not eat food or drink liquids after midnight.  Take these medicines the morning of surgery with A SIP OF WATER   Do not wear jewelry, make-up or nail polish.  Do not wear lotions, powders, or perfumes, or deoderant.  Do not shave 48 hours prior to surgery.  Men may shave face and neck.  Do not bring valuables to the hospital.  [facility] is not responsible for any belongings or valuables.  Contacts, dentures or bridgework may not be worn into surgery.  Leave your suitcase in the car.  After surgery it may be brought to your room.  For patients admitted to the hospital, discharge time will be determined by your treatment team.  Patients discharged the day of surgery will not be allowed to drive home.   Please read over the following fact sheets that you were given. Surgical Site Infection Prevention and Anesthesia Post-op Instructions     PATIENT INSTRUCTIONS POST-ANESTHESIA  IMMEDIATELY FOLLOWING SURGERY:  Do not drive or operate machinery for the first twenty four hours after surgery.  Do not make any important decisions for twenty four hours after surgery or while taking narcotic pain medications or sedatives.  If you develop intractable nausea and vomiting or a severe headache please notify your doctor immediately.  FOLLOW-UP:  Please make an appointment with your surgeon as instructed. You do not need to follow up with anesthesia unless specifically instructed to do so.  WOUND CARE INSTRUCTIONS (if applicable):  Keep a dry clean dressing on the anesthesia/puncture wound site if there is drainage.  Once the wound has quit draining you may leave it open to air.  Generally you should leave the bandage intact for twenty four hours unless there is  drainage.  If the epidural site drains for more than 36-48 hours please call the anesthesia department.  QUESTIONS?:  Please feel free to call your physician or the hospital operator if you have any questions, and they will be happy to assist you.

## 2015-09-25 ENCOUNTER — Encounter (HOSPITAL_COMMUNITY): Payer: Self-pay

## 2015-09-25 ENCOUNTER — Encounter (HOSPITAL_COMMUNITY)
Admission: RE | Admit: 2015-09-25 | Discharge: 2015-09-25 | Disposition: A | Discharge status: Home / Self Care | Payer: 59 | Source: Ambulatory Visit | Attending: Orthopedic Surgery | Admitting: Orthopedic Surgery

## 2015-09-25 DIAGNOSIS — M795 Residual foreign body in soft tissue: Secondary | ICD-10-CM | POA: Insufficient documentation

## 2015-09-25 HISTORY — DX: Headache, unspecified: R51.9

## 2015-09-25 HISTORY — DX: Headache: R51

## 2015-09-25 LAB — BASIC METABOLIC PANEL
Anion gap: 9 (ref 5–15)
BUN: 10 mg/dL (ref 6–20)
CALCIUM: 8.8 mg/dL — AB (ref 8.9–10.3)
CHLORIDE: 104 mmol/L (ref 101–111)
CO2: 25 mmol/L (ref 22–32)
CREATININE: 0.58 mg/dL (ref 0.44–1.00)
GFR calc non Af Amer: >60 mL/min (ref >60)
Glucose, Bld: 77 mg/dL (ref 65–99)
Potassium: 3.4 mmol/L — ABNORMAL LOW (ref 3.5–5.1)
SODIUM: 138 mmol/L (ref 135–145)

## 2015-09-25 LAB — CBC
HCT: 39.6 % (ref 36.0–46.0)
HEMOGLOBIN: 13.1 g/dL (ref 12.0–15.0)
MCH: 27.2 pg (ref 26.0–34.0)
MCHC: 33.1 g/dL (ref 30.0–36.0)
MCV: 82.2 fL (ref 78.0–100.0)
PLATELETS: 303 10*3/uL (ref 150–400)
RBC: 4.82 MIL/uL (ref 3.87–5.11)
RDW: 13.7 % (ref 11.5–15.5)
WBC: 9.8 10*3/uL (ref 4.0–10.5)

## 2015-09-25 LAB — HCG, SERUM, QUALITATIVE: PREG SERUM: NEGATIVE
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
## 2015-09-30 ENCOUNTER — Encounter (HOSPITAL_COMMUNITY)
Admission: RE | Disposition: A | Discharge status: Home / Self Care | Payer: Self-pay | Source: Ambulatory Visit | Attending: Orthopedic Surgery

## 2015-09-30 ENCOUNTER — Ambulatory Visit (HOSPITAL_COMMUNITY): Payer: 59 | Admitting: Anesthesiology

## 2015-09-30 ENCOUNTER — Ambulatory Visit (HOSPITAL_COMMUNITY): Payer: 59

## 2015-09-30 ENCOUNTER — Encounter (HOSPITAL_COMMUNITY): Payer: Self-pay | Admitting: *Deleted

## 2015-09-30 ENCOUNTER — Ambulatory Visit (HOSPITAL_COMMUNITY)
Admission: RE | Admit: 2015-09-30 | Discharge: 2015-09-30 | Disposition: A | Discharge status: Home / Self Care | Payer: 59 | Source: Ambulatory Visit | Attending: Orthopedic Surgery | Admitting: Orthopedic Surgery

## 2015-09-30 DIAGNOSIS — S60552D Superficial foreign body of left hand, subsequent encounter: Secondary | ICD-10-CM | POA: Diagnosis not present

## 2015-09-30 DIAGNOSIS — S60552A Superficial foreign body of left hand, initial encounter: Secondary | ICD-10-CM | POA: Insufficient documentation

## 2015-09-30 DIAGNOSIS — Z419 Encounter for procedure for purposes other than remedying health state, unspecified: Secondary | ICD-10-CM

## 2015-09-30 DIAGNOSIS — S60559A Superficial foreign body of unspecified hand, initial encounter: Secondary | ICD-10-CM

## 2015-09-30 DIAGNOSIS — I10 Essential (primary) hypertension: Secondary | ICD-10-CM | POA: Insufficient documentation

## 2015-09-30 HISTORY — PX: FOREIGN BODY REMOVAL: SHX962

## 2015-09-30 SURGERY — FOREIGN BODY REMOVAL ADULT
Anesthesia: Monitor Anesthesia Care | Site: Hand | Laterality: Left

## 2015-09-30 MED ORDER — DOXYCYCLINE HYCLATE 100 MG PO CAPS: 100.0000 mg | ORAL_CAPSULE | Freq: Two times a day (BID) | ORAL | 0 refills | Status: DC

## 2015-09-30 MED ORDER — BUPIVACAINE HCL (PF) 0.5 % IJ SOLN
INTRAMUSCULAR | Status: DC | PRN
  Administered 2015-09-30: 10 mL

## 2015-09-30 MED ORDER — SODIUM CHLORIDE 0.9 % IR SOLN
Status: DC | PRN
  Administered 2015-09-30: 1000 mL

## 2015-09-30 MED ORDER — HYDROCODONE-ACETAMINOPHEN 5-325 MG PO TABS
1.0000 | ORAL_TABLET | ORAL | 0 refills | Status: DC | PRN
Start: 2015-09-30 — End: 2018-02-01

## 2015-09-30 MED ORDER — LACTATED RINGERS IV SOLN
INTRAVENOUS | Status: DC
  Administered 2015-09-30: 12:00:00 via INTRAVENOUS

## 2015-09-30 MED ORDER — FENTANYL CITRATE (PF) 100 MCG/2ML IJ SOLN
INTRAMUSCULAR | Status: DC | PRN
  Administered 2015-09-30 (×2): 50 ug via INTRAVENOUS

## 2015-09-30 MED ORDER — LIDOCAINE HCL (PF) 1 % IJ SOLN
INTRAMUSCULAR | Status: AC
  Filled 2015-09-30: qty 5

## 2015-09-30 MED ORDER — FENTANYL CITRATE (PF) 100 MCG/2ML IJ SOLN
INTRAMUSCULAR | Status: AC
  Filled 2015-09-30: qty 2

## 2015-09-30 MED ORDER — LIDOCAINE HCL (CARDIAC) 10 MG/ML IV SOLN
INTRAVENOUS | Status: DC | PRN
  Administered 2015-09-30: 50 mg via INTRAVENOUS

## 2015-09-30 MED ORDER — PROPOFOL 10 MG/ML IV BOLUS
INTRAVENOUS | Status: AC
  Filled 2015-09-30: qty 20

## 2015-09-30 MED ORDER — VANCOMYCIN HCL IN DEXTROSE 1-5 GM/200ML-% IV SOLN
INTRAVENOUS | Status: AC
  Filled 2015-09-30: qty 200

## 2015-09-30 MED ORDER — MIDAZOLAM HCL 2 MG/2ML IJ SOLN
INTRAMUSCULAR | Status: AC
  Filled 2015-09-30: qty 2

## 2015-09-30 MED ORDER — BUPIVACAINE HCL (PF) 0.5 % IJ SOLN
INTRAMUSCULAR | Status: AC
  Filled 2015-09-30: qty 30

## 2015-09-30 MED ORDER — VANCOMYCIN HCL IN DEXTROSE 1-5 GM/200ML-% IV SOLN
1000.0000 mg | INTRAVENOUS | Status: AC
  Administered 2015-09-30: 1000 mg via INTRAVENOUS

## 2015-09-30 MED ORDER — MIDAZOLAM HCL 2 MG/2ML IJ SOLN
1.0000 mg | INTRAMUSCULAR | Status: DC | PRN
  Administered 2015-09-30: 2 mg via INTRAVENOUS

## 2015-09-30 MED ORDER — PROPOFOL 10 MG/ML IV BOLUS
INTRAVENOUS | Status: DC | PRN
  Administered 2015-09-30: 150 mg via INTRAVENOUS
  Administered 2015-09-30: 30 mg via INTRAVENOUS
  Administered 2015-09-30: 20 mg via INTRAVENOUS

## 2015-09-30 MED ORDER — CHLORHEXIDINE GLUCONATE 4 % EX LIQD: 60.0000 mL | Freq: Once | CUTANEOUS | Status: DC

## 2015-09-30 MED ORDER — LIDOCAINE HCL (PF) 1 % IJ SOLN
INTRAMUSCULAR | Status: AC
  Filled 2015-09-30: qty 30

## 2015-09-30 SURGICAL SUPPLY — 44 items
BAG HAMPER (MISCELLANEOUS) ×3 IMPLANT
BANDAGE ELASTIC 3 LF NS (GAUZE/BANDAGES/DRESSINGS) ×2 IMPLANT
BANDAGE ELASTIC 4 VELCRO NS (GAUZE/BANDAGES/DRESSINGS) IMPLANT
BANDAGE ESMARK 4X12 BL STRL LF (DISPOSABLE) ×1 IMPLANT
BNDG CMPR 12X4 ELC STRL LF (DISPOSABLE) ×1
BNDG CMPR MED 5X3 ELC HKLP NS (GAUZE/BANDAGES/DRESSINGS) ×1
BNDG COHESIVE 4X5 TAN STRL (GAUZE/BANDAGES/DRESSINGS) IMPLANT
BNDG ESMARK 4X12 BLUE STRL LF (DISPOSABLE) ×3
BNDG GAUZE ELAST 4 BULKY (GAUZE/BANDAGES/DRESSINGS) ×2 IMPLANT
CHLORAPREP W/TINT 26ML (MISCELLANEOUS) ×2 IMPLANT
CLOTH BEACON ORANGE TIMEOUT ST (SAFETY) ×3 IMPLANT
COVER LIGHT HANDLE STERIS (MISCELLANEOUS) ×6 IMPLANT
COVER MAYO STAND XLG (DRAPE) ×2 IMPLANT
CUFF TOURNIQUET SINGLE 18IN (TOURNIQUET CUFF) ×3 IMPLANT
DECANTER SPIKE VIAL GLASS SM (MISCELLANEOUS) ×2 IMPLANT
DRAPE HALF SHEET 40X57 (DRAPES) ×2 IMPLANT
DRAPE X-RAY CASS 24X20 (DRAPES) ×2 IMPLANT
DRSG XEROFORM 1X8 (GAUZE/BANDAGES/DRESSINGS) ×3 IMPLANT
ELECT REM PT RETURN 9FT ADLT (ELECTROSURGICAL) ×3
ELECTRODE REM PT RTRN 9FT ADLT (ELECTROSURGICAL) ×1 IMPLANT
GAUZE SPONGE 4X4 12PLY STRL (GAUZE/BANDAGES/DRESSINGS) ×3 IMPLANT
GLOVE BIOGEL PI IND STRL 7.0 (GLOVE) ×1 IMPLANT
GLOVE BIOGEL PI INDICATOR 7.0 (GLOVE) ×2
GLOVE SKINSENSE NS SZ8.0 LF (GLOVE) ×2
GLOVE SKINSENSE STRL SZ8.0 LF (GLOVE) ×1 IMPLANT
GLOVE SS N UNI LF 8.5 STRL (GLOVE) ×3 IMPLANT
GOWN STRL REUS W/TWL LRG LVL3 (GOWN DISPOSABLE) ×12 IMPLANT
GOWN STRL REUS W/TWL XL LVL3 (GOWN DISPOSABLE) ×3 IMPLANT
HAND ALUMI XLG (SOFTGOODS) ×2 IMPLANT
INST SET MINOR BONE (KITS) ×3 IMPLANT
KIT ROOM TURNOVER APOR (KITS) ×3 IMPLANT
MANIFOLD NEPTUNE II (INSTRUMENTS) ×3 IMPLANT
NDL HYPO 21X1.5 SAFETY (NEEDLE) IMPLANT
NEEDLE HYPO 21X1.5 SAFETY (NEEDLE) ×3 IMPLANT
NS IRRIG 1000ML POUR BTL (IV SOLUTION) ×3 IMPLANT
PACK BASIC LIMB (CUSTOM PROCEDURE TRAY) ×3 IMPLANT
PAD ARMBOARD 7.5X6 YLW CONV (MISCELLANEOUS) ×3 IMPLANT
PAD CAST 4YDX4 CTTN HI CHSV (CAST SUPPLIES) ×1 IMPLANT
PADDING CAST COTTON 4X4 STRL (CAST SUPPLIES) ×3
SET BASIN LINEN APH (SET/KITS/TRAYS/PACK) ×3 IMPLANT
SUT ETHILON 3 0 FSL (SUTURE) ×2 IMPLANT
SYR BULB IRRIGATION 50ML (SYRINGE) ×3 IMPLANT
SYR CONTROL 10ML LL (SYRINGE) ×2 IMPLANT
VESSEL CANN W0 1 W VA 30003 (MISCELLANEOUS) ×3 IMPLANT

## 2015-09-30 NOTE — Anesthesia Procedure Notes (Signed)
Procedure Name: LMA Insertion Date/Time: 09/30/2015 12:48 PM Performed by: Franco NonesYATES, Merilynn Haydu S Pre-anesthesia Checklist: Patient identified, Patient being monitored, Emergency Drugs available, Timeout performed and Suction available Patient Re-evaluated:Patient Re-evaluated prior to inductionOxygen Delivery Method: Circle System Utilized Preoxygenation: Pre-oxygenation with 100% oxygen Intubation Type: IV induction Ventilation: Mask ventilation without difficulty LMA: LMA inserted LMA Size: 3.0 Number of attempts: 1 Placement Confirmation: positive ETCO2 and breath sounds checked- equal and bilateral

## 2015-09-30 NOTE — Anesthesia Postprocedure Evaluation (Signed)
Anesthesia Post Note  Patient: Elaine Hernandez  Procedure(s) Performed: Procedure(s) (LRB): FOREIGN BODY REMOVAL ADULT (Left)  Patient location during evaluation: PACU Anesthesia Type: General Level of consciousness: awake and alert Pain management: pain level controlled Vital Signs Assessment: post-procedure vital signs reviewed and stable Respiratory status: spontaneous breathing Cardiovascular status: stable Anesthetic complications: no    Last Vitals:  Vitals:   09/30/15 1342 09/30/15 1345  BP: 126/69 120/66  Pulse:  (!) 101  Resp: (!) 9 (!) 21  Temp: 36.7 C     Last Pain:  Vitals:   09/30/15 1117  TempSrc: Oral                 March Joos

## 2015-09-30 NOTE — Brief Op Note (Addendum)
09/30/2015  1:50 PM  PATIENT:  Elaine Hernandez  20 y.o. female  PRE-OPERATIVE DIAGNOSIS:  FOREIGN BODY LEFT HAND  POST-OPERATIVE DIAGNOSIS:  FOREIGN BODY LEFT HAND  PROCEDURE:  Procedure(s) with comments: FOREIGN BODY REMOVAL ADULT (Left) - LEFT HAND  Findings one piece of glass dorsal aspect subcutaneous tissue left hand  Details of procedure  Elaine Hernandez was identified in the preop area. We confirm the surgical site marked reviewed the chart and taken to surgery. Secondary to amoxicillin allergy she had a gram of vancomycin and had general anesthesia.  After timeout the limb was exsanguinated with a 4 to Esmarch and the tourniquet was elevated up to 250 mmHg. An incision was made in between the web space of the first and second metacarpals. Blunt dissection was carried out in the glass was removed  On the preop x-ray we saw 2 pieces of glass so we brought in the mini C-arm and did several fluoroscopic images and did not find a second piece of glass  I was concerned it further exploration of the wound did not find any glass and therefore brought in the regular x-ray and took another film AP and lateral did not find any other glass material  We irrigated the wound and closed with 3-0 nylon sutures in interrupted fashion  We injected 10 mL of Marcaine for postoperative anesthesia  Sterile bandage was applied  We let the tourniquet down the color vascularity came back to the hand and she was extubated and taken to recovery in good condition   SURGEON:  Surgeon(s) and Role:    * Jahbari Repinski E Raylan Hanton, MD - Primary  PHYSICIAN ASSISTANT:   ASSISTANTS: betty ashley   ANESTHESIA:   general  EBL:  Total I/O In: 900 [I.V.:900] Out: 0   BLOOD ADMINISTERED:none  DRAINS: none   LOCAL MEDICATIONS USED:  MARCAINE     SPECIMEN:  No Specimen  DISPOSITION OF SPECIMEN:  N/A  COUNTS:  YES  TOURNIQUET:   Total Tourniquet Time Documented: Upper Arm (Left) - 31 minutes Total:  Upper Arm (Left) - 31 minutes   DICTATION: .Dragon Dictation  PLAN OF CARE: Discharge to home after PACU  PATIENT DISPOSITION:  PACU - hemodynamically stable.   Delay start of Pharmacological VTE agent (>24hrs) due to surgical blood loss or risk of bleeding: not applicable  

## 2015-09-30 NOTE — Transfer of Care (Signed)
Immediate Anesthesia Transfer of Care Note  Patient: Elaine Hernandez  Procedure(s) Performed: Procedure(s) with comments: FOREIGN BODY REMOVAL ADULT (Left) - LEFT HAND  Patient Location: PACU  Anesthesia Type:General  Level of Consciousness: sedated and patient cooperative  Airway & Oxygen Therapy: Patient Spontanous Breathing and non-rebreather face mask  Post-op Assessment: Report given to RN and Post -op Vital signs reviewed and stable  Post vital signs: Reviewed  Last Vitals:  Vitals:   09/30/15 1117 09/30/15 1200  BP: (!) 136/98 (!) 161/99  Pulse: (!) 108   Resp: 10 18  Temp: 36.8 C     Last Pain:  Vitals:   09/30/15 1117  TempSrc: Oral      Patients Stated Pain Goal: 8 (09/30/15 1117)  Complications: No apparent anesthesia complications

## 2015-09-30 NOTE — Op Note (Signed)
09/30/2015  1:50 PM  PATIENT:  Elaine RepressAlexis S Batson  20 y.o. female  PRE-OPERATIVE DIAGNOSIS:  FOREIGN BODY LEFT HAND  POST-OPERATIVE DIAGNOSIS:  FOREIGN BODY LEFT HAND  PROCEDURE:  Procedure(s) with comments: FOREIGN BODY REMOVAL ADULT (Left) - LEFT HAND  Findings one piece of glass dorsal aspect subcutaneous tissue left hand  Details of procedure  April Holdinglexis Behring was identified in the preop area. We confirm the surgical site marked reviewed the chart and taken to surgery. Secondary to amoxicillin allergy she had a gram of vancomycin and had general anesthesia.  After timeout the limb was exsanguinated with a 4 to Esmarch and the tourniquet was elevated up to 250 mmHg. An incision was made in between the web space of the first and second metacarpals. Blunt dissection was carried out in the glass was removed  On the preop x-ray we saw 2 pieces of glass so we brought in the mini C-arm and did several fluoroscopic images and did not find a second piece of glass  I was concerned it further exploration of the wound did not find any glass and therefore brought in the regular x-ray and took another film AP and lateral did not find any other glass material  We irrigated the wound and closed with 3-0 nylon sutures in interrupted fashion  We injected 10 mL of Marcaine for postoperative anesthesia  Sterile bandage was applied  We let the tourniquet down the color vascularity came back to the hand and she was extubated and taken to recovery in good condition   SURGEON:  Surgeon(s) and Role:    * Vickki HearingStanley E Latiesha Harada, MD - Primary  PHYSICIAN ASSISTANT:   ASSISTANTS: betty ashley   ANESTHESIA:   general  EBL:  Total I/O In: 900 [I.V.:900] Out: 0   BLOOD ADMINISTERED:none  DRAINS: none   LOCAL MEDICATIONS USED:  MARCAINE     SPECIMEN:  No Specimen  DISPOSITION OF SPECIMEN:  N/A  COUNTS:  YES  TOURNIQUET:   Total Tourniquet Time Documented: Upper Arm (Left) - 31 minutes Total:  Upper Arm (Left) - 31 minutes   DICTATION: .Reubin Milanragon Dictation  PLAN OF CARE: Discharge to home after PACU  PATIENT DISPOSITION:  PACU - hemodynamically stable.   Delay start of Pharmacological VTE agent (>24hrs) due to surgical blood loss or risk of bleeding: not applicable

## 2015-09-30 NOTE — Anesthesia Preprocedure Evaluation (Addendum)
Anesthesia Evaluation  Patient identified by MRN, date of birth, ID band Patient awake    Reviewed: Allergy & Precautions, NPO status , Patient's Chart, lab work & pertinent test results  History of Anesthesia Complications Negative for: history of anesthetic complications  Airway Mallampati: II  TM Distance: >3 FB Neck ROM: Full    Dental no notable dental hx. (+) Dental Advisory Given   Pulmonary neg pulmonary ROS,    Pulmonary exam normal        Cardiovascular hypertension, negative cardio ROS Normal cardiovascular exam     Neuro/Psych  Headaches, PSYCHIATRIC DISORDERS Anxiety    GI/Hepatic negative GI ROS, Neg liver ROS,   Endo/Other  negative endocrine ROS  Renal/GU negative Renal ROS  negative genitourinary   Musculoskeletal   Abdominal   Peds negative pediatric ROS (+)  Hematology negative hematology ROS (+)   Anesthesia Other Findings   Reproductive/Obstetrics negative OB ROS                            Anesthesia Physical Anesthesia Plan  ASA: II  Anesthesia Plan: General   Post-op Pain Management:    Induction: Intravenous  Airway Management Planned: LMA  Additional Equipment:   Intra-op Plan:   Post-operative Plan:   Informed Consent: I have reviewed the patients History and Physical, chart, labs and discussed the procedure including the risks, benefits and alternatives for the proposed anesthesia with the patient or authorized representative who has indicated his/her understanding and acceptance.   Dental advisory given  Plan Discussed with: CRNA and Anesthesiologist  Anesthesia Plan Comments:     Anesthesia Quick Evaluation

## 2015-09-30 NOTE — Interval H&P Note (Signed)
History and Physical Interval Note:  09/30/2015 12:36 PM  Elaine Hernandez  has presented today for surgery, with the diagnosis of FOREIGN BODY LEFT HAND  The various methods of treatment have been discussed with the patient and family. After consideration of risks, benefits and other options for treatment, the patient has consented to  Procedure(s) with comments: FOREIGN BODY REMOVAL ADULT (Left) - LEFT HAND as a surgical intervention .  The patient's history has been reviewed, patient examined, no change in status, stable for surgery.  I have reviewed the patient's chart and labs.  Questions were answered to the patient's satisfaction.     Fuller CanadaStanley Harrison

## 2015-10-06 ENCOUNTER — Encounter (HOSPITAL_COMMUNITY): Payer: Self-pay | Admitting: Orthopedic Surgery

## 2015-10-06 ENCOUNTER — Ambulatory Visit (INDEPENDENT_AMBULATORY_CARE_PROVIDER_SITE_OTHER): Payer: 59 | Admitting: Orthopedic Surgery

## 2015-10-06 VITALS — BP 153/92 | HR 107 | Wt 177.0 lb

## 2015-10-06 DIAGNOSIS — Z4789 Encounter for other orthopedic aftercare: Secondary | ICD-10-CM

## 2015-10-06 NOTE — Progress Notes (Signed)
Patient ID: Elaine Hernandez, female   DOB: May 12, 1995, 20 y.o.   MRN: [id number]  Post op visit   Chief Complaint  Patient presents with  . Follow-up    post op 1, left hand foreign body removal, DOS 09/30/15    BP (!) 153/92   Pulse (!) 107   Wt 177 lb (80.3 kg)   BMI 29.91 kg/m   Glass removal left hand complains of left index finger numbness  Wound looks clean dry and intact patient is to apply Neosporin keep covered okay to take a shower  Come back for suture removal continue work out status

## 2015-10-06 NOTE — Patient Instructions (Signed)
OUT OF WORK NOTE SEPT 19 - OCT 4

## 2015-10-07 ENCOUNTER — Telehealth: Payer: Self-pay | Admitting: Orthopedic Surgery

## 2015-10-14 ENCOUNTER — Ambulatory Visit (INDEPENDENT_AMBULATORY_CARE_PROVIDER_SITE_OTHER): Payer: 59 | Admitting: Orthopedic Surgery

## 2015-10-14 ENCOUNTER — Encounter: Payer: Self-pay | Admitting: Orthopedic Surgery

## 2015-10-14 VITALS — BP 129/89 | HR 88 | Ht 64.5 in | Wt 174.0 lb

## 2015-10-14 DIAGNOSIS — B3731 Acute candidiasis of vulva and vagina: Secondary | ICD-10-CM

## 2015-10-14 DIAGNOSIS — B373 Candidiasis of vulva and vagina: Secondary | ICD-10-CM

## 2015-10-14 MED ORDER — FLUCONAZOLE 150 MG PO TABS: 150.0000 mg | ORAL_TABLET | Freq: Once | ORAL | 0 refills | Status: AC

## 2015-10-14 NOTE — Progress Notes (Signed)
Patient ID: Elaine RepressAlexis S Varricchio, female   DOB: 04-05-1995, 20 y.o.   MRN: [id number]  Post op visit   Chief Complaint  Patient presents with  . Follow-up    Recheck on left hand, sutures out, DOS 09-30-15.    BP 129/89   Pulse 88   Ht 5' 4.5" (1.638 m)   Wt 174 lb (78.9 kg)   BMI 29.41 kg/m   Foreign body glass removal left hand. Took sutures out. There are no signs of infection.  Patient did complain of needing something for yeast infection so she was put on Diflucan

## 2017-03-15 ENCOUNTER — Other Ambulatory Visit: Payer: Self-pay

## 2017-03-15 ENCOUNTER — Emergency Department (HOSPITAL_COMMUNITY)
Admission: EM | Admit: 2017-03-15 | Discharge: 2017-03-15 | Disposition: A | Discharge status: Home / Self Care | Payer: 59 | Attending: Emergency Medicine | Admitting: Emergency Medicine

## 2017-03-15 ENCOUNTER — Encounter (HOSPITAL_COMMUNITY): Payer: Self-pay | Admitting: Emergency Medicine

## 2017-03-15 DIAGNOSIS — Z79899 Other long term (current) drug therapy: Secondary | ICD-10-CM | POA: Insufficient documentation

## 2017-03-15 DIAGNOSIS — R030 Elevated blood-pressure reading, without diagnosis of hypertension: Secondary | ICD-10-CM

## 2017-03-15 DIAGNOSIS — J0100 Acute maxillary sinusitis, unspecified: Secondary | ICD-10-CM

## 2017-03-15 DIAGNOSIS — I10 Essential (primary) hypertension: Secondary | ICD-10-CM | POA: Insufficient documentation

## 2017-03-15 MED ORDER — FLUCONAZOLE 150 MG PO TABS: 150.0000 mg | ORAL_TABLET | Freq: Every day | ORAL | 0 refills | Status: AC

## 2017-03-15 MED ORDER — FLUTICASONE PROPIONATE 50 MCG/ACT NA SUSP: 1.0000 | Freq: Every day | NASAL | 0 refills | Status: DC

## 2017-03-15 MED ORDER — AZITHROMYCIN 250 MG PO TABS
500.0000 mg | ORAL_TABLET | Freq: Once | ORAL | Status: AC
  Administered 2017-03-15: 500 mg via ORAL
  Filled 2017-03-15: qty 2

## 2017-03-15 MED ORDER — AZITHROMYCIN 250 MG PO TABS: 250.0000 mg | ORAL_TABLET | Freq: Every day | ORAL | 0 refills | Status: DC

## 2017-03-15 NOTE — ED Notes (Signed)
Signature pad not working in room. Patient given discharge papers and prescriptions. Verbalized understanding.

## 2017-03-15 NOTE — ED Triage Notes (Signed)
Patient reports cold and congestion intermittent over the pat 3 weeks. Patient states today she has been more congested in her throat. No n/v/d.

## 2017-03-15 NOTE — ED Provider Notes (Signed)
[facility] Provider Note   CSN: [id number] Arrival date & time: 03/15/17  1145     History   Chief Complaint Chief Complaint  Patient presents with  . Nasal Congestion    HPI Marvis Represslexis S Guarisco is a 22 y.o. female reporting back to back uri symptoms the past couple of weeks, stating she and her family have been passing this infection back and forth.  She just got over nasal congestion including  clear rhinorrhea along with sore throat and cough when she redeveloped nasal congestion along with sinus pain and thick nasal congestion with postnasal drip, some blood-tinged.  Her symptoms are worse at night and when she first wakes in the morning.  She has had no medications for her symptoms.  She denies cough, shortness of breath or chest pain.  She had an episode of postnasal drip this morning and when she coughed it out there was a large amount of blood mixed with her nasal discharge and is concerned for a possible sinus infection. She denies headache.   The history is provided by the patient.    Past Medical History:  Diagnosis Date  . Headache   . Hypertension   . Panic attacks     Patient Active Problem List   Diagnosis Date Noted  . Foreign body, hand, superficial     Past Surgical History:  Procedure Laterality Date  . FOREIGN BODY REMOVAL Left 09/30/2015   Procedure: FOREIGN BODY REMOVAL ADULT;  Surgeon: Vickki HearingStanley E Harrison, MD;  Location: [facility];  Service: Orthopedics;  Laterality: Left;  LEFT HAND  . WISDOM TOOTH EXTRACTION Bilateral     OB History    Gravida Para Term Preterm AB Living   0 0           SAB TAB Ectopic Multiple Live Births                   Home Medications    Prior to Admission medications   Medication Sig Start Date End Date Taking? Authorizing Provider  azithromycin (ZITHROMAX) 250 MG tablet Take 1 tablet (250 mg total) by mouth daily. Take one tablet daily for 4 days starting 03/16/17 03/15/17   Burgess AmorIdol, Daiquan Resnik, PA-C  doxycycline  (VIBRAMYCIN) 100 MG capsule Take 1 capsule (100 mg total) by mouth 2 (two) times daily. 09/30/15   Vickki HearingHarrison, Stanley E, MD  Etonogestrel [facility](NEXPLANON [place]) Inject 1 each into the skin once.    [provider]  fluticasone (FLONASE) 50 MCG/ACT nasal spray Place 1 spray into both nostrils daily. 03/15/17   Burgess AmorIdol, Oline Belk, PA-C  HYDROcodone-acetaminophen (NORCO/VICODIN) 5-325 MG tablet Take 1 tablet by mouth every 4 (four) hours as needed for moderate pain. 09/30/15   Vickki HearingHarrison, Stanley E, MD    Family History Family History  Problem Relation Age of Onset  . Hypertension Mother   . Hypertension Father     Social History Social History   Tobacco Use  . Smoking status: Never Smoker  . Smokeless tobacco: Never Used  Substance Use Topics  . Alcohol use: No  . Drug use: No     Allergies   Amoxicillin; Cefdinir; and Other   Review of Systems Review of Systems  Constitutional: Negative for chills and fever.  HENT: Positive for congestion and rhinorrhea. Negative for ear pain, sinus pressure, sore throat, trouble swallowing and voice change.   Eyes: Negative for discharge.  Respiratory: Negative for cough, shortness of breath, wheezing and stridor.   Cardiovascular: Negative for  chest pain.  Gastrointestinal: Negative for abdominal pain.  Genitourinary: Negative.   Neurological: Negative for headaches.     Physical Exam Updated Vital Signs BP (!) 171/103 (BP Location: Left Arm)   Pulse 93   Temp 98.5 F (36.9 C) (Oral)   Resp 18   Ht 5\' 5"  (1.651 m)   Wt 86.2 kg (190 lb)   LMP 02/08/2017 Comment: irregular  SpO2 100%   BMI 31.62 kg/m   Physical Exam  Constitutional: She is oriented to person, place, and time. She appears well-developed and well-nourished.  HENT:  Head: Normocephalic and atraumatic.  Right Ear: Tympanic membrane and ear canal normal.  Left Ear: Tympanic membrane and ear canal normal.  Nose: Mucosal edema and rhinorrhea present. Left sinus exhibits  maxillary sinus tenderness.  Mouth/Throat: Uvula is midline, oropharynx is clear and moist and mucous membranes are normal. No oropharyngeal exudate, posterior oropharyngeal edema, posterior oropharyngeal erythema or tonsillar abscesses.  Eyes: Conjunctivae are normal.  Cardiovascular: Normal rate and normal heart sounds.  Pulmonary/Chest: Effort normal. No respiratory distress. She has no wheezes. She has no rales. She exhibits no tenderness.  Abdominal: Soft. There is no tenderness.  Musculoskeletal: Normal range of motion.  Neurological: She is alert and oriented to person, place, and time.  Skin: Skin is warm and dry. No rash noted.  Psychiatric: She has a normal mood and affect.     ED Treatments / Results  Labs (all labs ordered are listed, but only abnormal results are displayed) Labs Reviewed - No data to display  EKG  EKG Interpretation None       Radiology No results found.  Procedures Procedures (including critical care time)  Medications Ordered in ED Medications  azithromycin (ZITHROMAX) tablet 500 mg (500 mg Oral Given 03/15/17 1326)     Initial Impression / Assessment and Plan / ED Course  I have reviewed the triage vital signs and the nursing notes.  Pertinent labs & imaging results that were available during my care of the patient were reviewed by me and considered in my medical decision making (see chart for details).     Exam and history suggesting acute sinusitis.  She was placed on Zithromax given her allergy to penicillin and cephalosporins.  Also prescribed Flonase and suggested nasal saline spray.  Discussed elevated blood pressure and her need for close recheck of this.  PRN follow-up anticipated here.  The patient appears reasonably screened and/or stabilized for discharge and I doubt any other medical condition or other [facility] requiring further screening, evaluation, or treatment in the ED at this time prior to discharge.  Final Clinical  Impressions(s) / ED Diagnoses   Final diagnoses:  Acute non-recurrent maxillary sinusitis  Elevated blood pressure reading    ED Discharge Orders        Ordered    azithromycin (ZITHROMAX) 250 MG tablet  Daily     03/15/17 1319    fluticasone (FLONASE) 50 MCG/ACT nasal spray  Daily     03/15/17 1319       IdolRaynelle Fanning, PA-C 03/15/17 1330    Samuel Jester, DO 03/16/17 2233

## 2017-03-15 NOTE — Discharge Instructions (Signed)
Take your next dose of zithromax tomorrow. Nasal saline spray can also help with nasal dryness and congestion.  Make sure you are drinking plenty of fluids and get rest. You do need close followup of your blood pressure as it was elevated today as discussed. Uncontrolled blood pressure can result in heart, lungs, artery, brain and kidney injury. Call your doctor for a recheck of this.  You can also go to the [facility] for a recheck of this.  [facility]  [address] [place], [place] [address] [phone]  Services [facility] offers a variety of basic health services.  Services include but are not limited to: Blood pressure checks  Heart rate checks  Blood sugar checks  Urine analysis  Rapid strep tests  Pregnancy tests.  Health education and referrals  People needing more complex services will be directed to a physician online. Using these virtual visits, doctors can evaluate and prescribe medicine and treatments. There will be no medication on-site, though [facility] will help patients fill their prescriptions at little to no cost.   For More information please go to: [email or web address]

## 2017-03-21 ENCOUNTER — Telehealth: Payer: Self-pay

## 2017-03-21 NOTE — Telephone Encounter (Signed)
Called to follow up with client after MAP screening [facility] [place] to establish primary care scheduled for 03/20/17.  Client states she is at work currently and cannot talk. She states she did not make that screening appointment on 03/20/17 and will have to reschedule. Client states she will call back when she is not at work.   Francee NodalPatricia R Brevon Dewald RN [facility]  [phone]

## 2017-03-21 NOTE — Congregational Nurse Program (Signed)
Congregational Nurse Program Note  Date of Encounter: 03/15/2017  Past Medical History: Past Medical History:  Diagnosis Date  . Headache   . Hypertension   . Panic attacks     Encounter Details: CNP Questionnaire - 03/15/17 1600      Questionnaire   Patient Status  Not Applicable    Race  Black or African American    Location Patient Served At  [facility]    Insurance  Not Applicable    Uninsured  Uninsured (NEW 1x/quarter)    Food  No food insecurities    Housing/Utilities  Yes, have permanent housing    Transportation  No transportation needs    Interpersonal Safety  Yes, feel physically and emotionally safe where you currently live    Medication  No medication insecurities    Medical Provider  No    Referrals  Primary Care Provider/Clinic    ED Visit Averted  Not Applicable    Life-Saving Intervention Made  Not Applicable       [facility]. Client came here after being seen in the [facility] emergency room today, where she was seen and treated for an acute sinus infection. She has no primary care provider. Client was prescribed Zithromax which she states she has already filled. She states while there she was told her blood pressure needs "watching" and that she needs a primary care provider. Client works full time for [employer] [place] and part-time at [employer]. She states she can insurance through [employer] but she failed to sign up during enrollment therefore she cannot sign up again until fall. She lives at home with her parents. She has transportation and no other concerns per client than establishing a primary care provider. She states it has been "forever" since she has seen a primary care provider. She has not had any labs drawn since 2017 per client.  RN counseled client extensively on establishing a primary care and in signing up for insurance at work when eligible. Discussed that she needs primary care to monitor and treat hypertension  if necessary as well as routine health maintenance and to have for non emergent medical needs. Client states understanding. Client lives in [place] so RN discussed the [facility] which she could begin with now and if she get insurance could still remain there. Client is agreeable. RN called [facility] and setup an appointment for a MAP screening (medical access program) for 03/20/17 at 2 pm.  Client given a brochure with information that she will need to carry with her.client states understanding.  RN counseled client on low salt diet and exercise. Client states she will be starting with a nutritionist and trainer at the [employer] soon. Discussed healthy meal choices other than fast food and fries while working. Discussed the risks of uncontrolled hypertension such as stroke and heart attack. Handouts given on low salt diet, healthy lifestyle, stroke prevention as well as hypertension. Client agreeable for follow up by phone next week to follow up [facility]. Discussed with client there are other alternatives for primary care if [facility] does not work out. Client states understanding. List of primary care providers taking new clients given to client also with contact information.   Will follow up with client 03/21/17
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
## 2017-08-18 ENCOUNTER — Encounter: Payer: Self-pay | Admitting: Obstetrics and Gynecology

## 2017-09-04 ENCOUNTER — Encounter: Payer: Self-pay | Admitting: Obstetrics and Gynecology

## 2018-02-01 ENCOUNTER — Ambulatory Visit: Payer: Managed Care, Other (non HMO) | Admitting: Women's Health

## 2018-02-01 ENCOUNTER — Encounter: Payer: Self-pay | Admitting: Women's Health

## 2018-02-01 VITALS — BP 144/96 | HR 84 | Ht 64.5 in | Wt 189.0 lb

## 2018-02-01 DIAGNOSIS — I10 Essential (primary) hypertension: Secondary | ICD-10-CM

## 2018-02-01 DIAGNOSIS — Z3049 Encounter for surveillance of other contraceptives: Secondary | ICD-10-CM

## 2018-02-01 DIAGNOSIS — Z3046 Encounter for surveillance of implantable subdermal contraceptive: Secondary | ICD-10-CM

## 2018-02-01 DIAGNOSIS — Z3202 Encounter for pregnancy test, result negative: Secondary | ICD-10-CM

## 2018-02-01 LAB — POCT URINE PREGNANCY: Preg Test, Ur: NEGATIVE

## 2018-02-01 NOTE — Patient Instructions (Signed)
Keep the area clean and dry.  You can remove the big bandage in 24 hours, and the small steri-strip bandage in 3-5 days.  A back up method, such as condoms, should be used for two weeks.    

## 2018-02-01 NOTE — Progress Notes (Signed)
   NEXPLANON REMOVAL Patient name: Elaine Hernandez MRN [id number]  Date of birth: 1995/10/20 Subjective Findings:   Elaine Hernandez is a 23 y.o. G0P0 African American female being seen today for removal of a Nexplanon. Her Nexplanon was placed 65yrs ago.  She desires removal because it's time. Signed copy of informed consent in chart.   Patient's last menstrual period was 01/27/2018 (exact date). Last pap >58yrs ago. Results were:  normal The planned method of family planning is condoms Pertinent History Reviewed:   Reviewed past medical,surgical, social, obstetrical and family history.  Reviewed problem list, medications and allergies. Objective Findings & Procedure:    Vitals:   02/01/18 1042  BP: (!) 144/96  Pulse: 84  Weight: 189 lb (85.7 kg)  Height: 5' 4.5" (1.638 m)  Body mass index is 31.94 kg/m.  Results for orders placed or performed in visit on 02/01/18 (from the past 24 hour(s))  POCT urine pregnancy   Collection Time: 02/01/18 10:46 AM  Result Value Ref Range   Preg Test, Ur Negative Negative     Time out was performed.  Nexplanon site identified.  Area prepped in usual sterile fashon. One cc of 2% lidocaine was used to anesthetize the area at the distal end of the implant. A small stab incision was made right beside the implant on the distal portion.  The Nexplanon rod was grasped using hemostats and removed without difficulty.  There was less than 3 cc blood loss. There were no complications.  Steri-strips were applied over the small incision and a pressure bandage was applied.  The patient tolerated the procedure well. Assessment & Plan:   1) Nexplanon removal She was instructed to keep the area clean and dry, remove pressure bandage in 24 hours, and keep insertion site covered with the steri-strip for 3-5 days.   Follow-up PRN problems.  Orders Placed This Encounter  Procedures  . POCT urine pregnancy    Follow-up: Return in about 4 weeks (around 03/01/2018) for  Pap & physical.  Cheral Marker CNM, [facility] 02/01/2018 11:26 AM

## 2018-02-05 ENCOUNTER — Encounter: Payer: Self-pay | Admitting: Adult Health

## 2018-02-19 ENCOUNTER — Telehealth: Payer: Self-pay | Admitting: Adult Health

## 2018-02-19 NOTE — Telephone Encounter (Signed)
Returned patients call. She had unprotected sex yesterday and wanted to know if taking plan b could help and what the side effects are. Advised patient that it could cause bleeding and nausea sometimes. Patient voiced understanding and had no other questions at this time.

## 2018-02-19 NOTE — Telephone Encounter (Signed)
Patient called, has question about taking certain medication due to her blood pressure issues.  [phone]

## 2018-03-02 ENCOUNTER — Other Ambulatory Visit: Payer: Managed Care, Other (non HMO) | Admitting: Women's Health

## 2018-03-09 ENCOUNTER — Ambulatory Visit: Payer: Managed Care, Other (non HMO) | Admitting: Adult Health

## 2018-03-09 ENCOUNTER — Other Ambulatory Visit (HOSPITAL_COMMUNITY)
Admission: RE | Admit: 2018-03-09 | Discharge: 2018-03-09 | Disposition: A | Discharge status: Home / Self Care | Payer: Managed Care, Other (non HMO) | Source: Ambulatory Visit | Attending: Adult Health | Admitting: Adult Health

## 2018-03-09 ENCOUNTER — Encounter: Payer: Self-pay | Admitting: Adult Health

## 2018-03-09 VITALS — BP 147/100 | HR 90 | Ht 64.75 in | Wt 182.5 lb

## 2018-03-09 DIAGNOSIS — Z3202 Encounter for pregnancy test, result negative: Secondary | ICD-10-CM | POA: Insufficient documentation

## 2018-03-09 DIAGNOSIS — I10 Essential (primary) hypertension: Secondary | ICD-10-CM

## 2018-03-09 DIAGNOSIS — Z30011 Encounter for initial prescription of contraceptive pills: Secondary | ICD-10-CM | POA: Insufficient documentation

## 2018-03-09 DIAGNOSIS — Z01419 Encounter for gynecological examination (general) (routine) without abnormal findings: Secondary | ICD-10-CM | POA: Insufficient documentation

## 2018-03-09 LAB — POCT URINE PREGNANCY: Preg Test, Ur: NEGATIVE

## 2018-03-09 MED ORDER — AMLODIPINE BESYLATE 5 MG PO TABS: 5.0000 mg | ORAL_TABLET | Freq: Every day | ORAL | 6 refills | Status: DC

## 2018-03-09 MED ORDER — NORETHINDRONE 0.35 MG PO TABS: 1.0000 | ORAL_TABLET | Freq: Every day | ORAL | 11 refills | Status: DC

## 2018-03-09 NOTE — Progress Notes (Addendum)
Patient ID: Elaine Hernandez, female   DOB: 01-19-1995, 23 y.o.   MRN: [id number] History of Present Illness: Elaine Hernandez is a 23 year old black female, single, G0P0, in for well woman gyn exam and pap. Had over due nexplanon removed in January and is wanting to get on OCs.Has taken Plan B about 3 weeks ago.Has been BP meds about a month and home check range 127-140 over 82-90.  PCP is Dr Margo Aye.   Current Medications, Allergies, Past Medical History, Past Surgical History, Family History and Social History were reviewed in [employer] record.     Review of Systems: Patient denies any headaches, hearing loss, fatigue, blurred vision, shortness of breath, chest pain, abdominal pain, problems with bowel movements, urination, or intercourse. No joint pain or mood swings.    Physical Exam:BP (!) 147/100 (BP Location: Right Arm, Patient Position: Sitting, Cuff Size: Normal)   Pulse 90   Ht 5' 4.75" (1.645 m)   Wt 182 lb 8 oz (82.8 kg)   LMP 03/03/2018   BMI 30.60 kg/m UPT negative.  General:  Well developed, well nourished, no acute distress Skin:  Warm and dry Neck:  Midline trachea, normal thyroid, good ROM, no lymphadenopathy Lungs; Clear to auscultation bilaterally Breast:  No dominant palpable mass, retraction, or nipple discharge Cardiovascular: Regular rate and rhythm Abdomen:  Soft, non tender, no hepatosplenomegaly Pelvic:  External genitalia is normal in appearance, no lesions.  The vagina is normal in appearance. Urethra has no lesions or masses. The cervix is nulliparous, pap with GC/CHL performed.  Uterus is felt to be normal size, shape, and contour.  No adnexal masses or tenderness noted.Bladder is non tender, no masses felt. Extremities/musculoskeletal:  No swelling or varicosities noted, no clubbing or cyanosis Psych:  No mood changes, alert and cooperative,seems happy Fall risk is low. PHQ 2 score is ). Examination chaperoned by Malachy Mood LPN. Will start  POP, can start today,and use condoms.  Impression: 1. Encounter for gynecological examination with Papanicolaou smear of cervix   2. Pregnancy examination or test, negative result   3. Encounter for initial prescription of contraceptive pills   4. Hypertension, unspecified type       Plan: Continue hydrodiuril Will add Norvasc 5 mg Meds ordered this encounter  Medications  . norethindrone (MICRONOR,CAMILA,ERRIN) 0.35 MG tablet    Sig: Take 1 tablet (0.35 mg total) by mouth daily.    Dispense:  1 Package    Refill:  11    Order Specific Question:   Supervising Provider    Answer:   Despina Hidden, LUTHER H [[id number]]  . amLODipine (NORVASC) 5 MG tablet    Sig: Take 1 tablet (5 mg total) by mouth daily.    Dispense:  30 tablet    Refill:  6    Order Specific Question:   Supervising Provider    Answer:   Lazaro Arms [[id number]]  Review handout DASH diet F/u in 6 weeks with me for BP and ROS on POP Physical in 1 year Pap in 3 if normal  Keep check on BP, F/U with PCP

## 2018-03-09 NOTE — Patient Instructions (Addendum)
DASH Eating Plan DASH stands for "Dietary Approaches to Stop Hypertension." The DASH eating plan is a healthy eating plan that has been shown to reduce high blood pressure (hypertension). It may also reduce your risk for type 2 diabetes, heart disease, and stroke. The DASH eating plan may also help with weight loss. What are tips for following this plan?  General guidelines  Avoid eating more than 2,300 mg (milligrams) of salt (sodium) a day. If you have hypertension, you may need to reduce your sodium intake to 1,500 mg a day.  Limit alcohol intake to no more than 1 drink a day for nonpregnant women and 2 drinks a day for men. One drink equals 12 oz of beer, 5 oz of wine, or 1 oz of hard liquor.  Work with your health care provider to maintain a healthy body weight or to lose weight. Ask what an ideal weight is for you.  Get at least 30 minutes of exercise that causes your heart to beat faster (aerobic exercise) most days of the week. Activities may include walking, swimming, or biking.  Work with your health care provider or diet and nutrition specialist (dietitian) to adjust your eating plan to your individual calorie needs. Reading food labels   Check food labels for the amount of sodium per serving. Choose foods with less than 5 percent of the Daily Value of sodium. Generally, foods with less than 300 mg of sodium per serving fit into this eating plan.  To find whole grains, look for the word "whole" as the first word in the ingredient list. Shopping  Buy products labeled as "low-sodium" or "no salt added."  Buy fresh foods. Avoid canned foods and premade or frozen meals. Cooking  Avoid adding salt when cooking. Use salt-free seasonings or herbs instead of table salt or sea salt. Check with your health care provider or pharmacist before using salt substitutes.  Do not fry foods. Cook foods using healthy methods such as baking, boiling, grilling, and broiling instead.  Cook with  heart-healthy oils, such as olive, canola, soybean, or sunflower oil. Meal planning  Eat a balanced diet that includes: ? 5 or more servings of fruits and vegetables each day. At each meal, try to fill half of your plate with fruits and vegetables. ? Up to 6-8 servings of whole grains each day. ? Less than 6 oz of lean meat, poultry, or fish each day. A 3-oz serving of meat is about the same size as a deck of cards. One egg equals 1 oz. ? 2 servings of low-fat dairy each day. ? A serving of nuts, seeds, or beans 5 times each week. ? Heart-healthy fats. Healthy fats called Omega-3 fatty acids are found in foods such as flaxseeds and coldwater fish, like sardines, salmon, and mackerel.  Limit how much you eat of the following: ? Canned or prepackaged foods. ? Food that is high in trans fat, such as fried foods. ? Food that is high in saturated fat, such as fatty meat. ? Sweets, desserts, sugary drinks, and other foods with added sugar. ? Full-fat dairy products.  Do not salt foods before eating.  Try to eat at least 2 vegetarian meals each week.  Eat more home-cooked food and less restaurant, buffet, and fast food.  When eating at a restaurant, ask that your food be prepared with less salt or no salt, if possible. What foods are recommended? The items listed may not be a complete list. Talk with your dietitian about   what dietary choices are best for you. Grains Whole-grain or whole-wheat bread. Whole-grain or whole-wheat pasta. Brown rice. Oatmeal. Quinoa. Bulgur. Whole-grain and low-sodium cereals. Pita bread. Low-fat, low-sodium crackers. Whole-wheat flour tortillas. Vegetables Fresh or frozen vegetables (raw, steamed, roasted, or grilled). Low-sodium or reduced-sodium tomato and vegetable juice. Low-sodium or reduced-sodium tomato sauce and tomato paste. Low-sodium or reduced-sodium canned vegetables. Fruits All fresh, dried, or frozen fruit. Canned fruit in natural juice (without  added sugar). Meat and other protein foods Skinless chicken or turkey. Ground chicken or turkey. Pork with fat trimmed off. Fish and seafood. Egg whites. Dried beans, peas, or lentils. Unsalted nuts, nut butters, and seeds. Unsalted canned beans. Lean cuts of beef with fat trimmed off. Low-sodium, lean deli meat. Dairy Low-fat (1%) or fat-free (skim) milk. Fat-free, low-fat, or reduced-fat cheeses. Nonfat, low-sodium ricotta or cottage cheese. Low-fat or nonfat yogurt. Low-fat, low-sodium cheese. Fats and oils Soft margarine without trans fats. Vegetable oil. Low-fat, reduced-fat, or light mayonnaise and salad dressings (reduced-sodium). Canola, safflower, olive, soybean, and sunflower oils. Avocado. Seasoning and other foods Herbs. Spices. Seasoning mixes without salt. Unsalted popcorn and pretzels. Fat-free sweets. What foods are not recommended? The items listed may not be a complete list. Talk with your dietitian about what dietary choices are best for you. Grains Baked goods made with fat, such as croissants, muffins, or some breads. Dry pasta or rice meal packs. Vegetables Creamed or fried vegetables. Vegetables in a cheese sauce. Regular canned vegetables (not low-sodium or reduced-sodium). Regular canned tomato sauce and paste (not low-sodium or reduced-sodium). Regular tomato and vegetable juice (not low-sodium or reduced-sodium). Pickles. Olives. Fruits Canned fruit in a light or heavy syrup. Fried fruit. Fruit in cream or butter sauce. Meat and other protein foods Fatty cuts of meat. Ribs. Fried meat. Bacon. Sausage. Bologna and other processed lunch meats. Salami. Fatback. Hotdogs. Bratwurst. Salted nuts and seeds. Canned beans with added salt. Canned or smoked fish. Whole eggs or egg yolks. Chicken or turkey with skin. Dairy Whole or 2% milk, cream, and half-and-half. Whole or full-fat cream cheese. Whole-fat or sweetened yogurt. Full-fat cheese. Nondairy creamers. Whipped toppings.  Processed cheese and cheese spreads. Fats and oils Butter. Stick margarine. Lard. Shortening. Ghee. Bacon fat. Tropical oils, such as coconut, palm kernel, or palm oil. Seasoning and other foods Salted popcorn and pretzels. Onion salt, garlic salt, seasoned salt, table salt, and sea salt. Worcestershire sauce. Tartar sauce. Barbecue sauce. Teriyaki sauce. Soy sauce, including reduced-sodium. Steak sauce. Canned and packaged gravies. Fish sauce. Oyster sauce. Cocktail sauce. Horseradish that you find on the shelf. Ketchup. Mustard. Meat flavorings and tenderizers. Bouillon cubes. Hot sauce and Tabasco sauce. Premade or packaged marinades. Premade or packaged taco seasonings. Relishes. Regular salad dressings. Where to find more information:  National Heart, Lung, and Blood Institute: www.nhlbi.nih.gov  American Heart Association: www.heart.org Summary  The DASH eating plan is a healthy eating plan that has been shown to reduce high blood pressure (hypertension). It may also reduce your risk for type 2 diabetes, heart disease, and stroke.  With the DASH eating plan, you should limit salt (sodium) intake to 2,300 mg a day. If you have hypertension, you may need to reduce your sodium intake to 1,500 mg a day.  When on the DASH eating plan, aim to eat more fresh fruits and vegetables, whole grains, lean proteins, low-fat dairy, and heart-healthy fats.  Work with your health care provider or diet and nutrition specialist (dietitian) to adjust your eating plan to your   individual calorie needs. This information is not intended to replace advice given to you by your health care provider. Make sure you discuss any questions you have with your health care provider. Document Released: 12/16/2010 Document Revised: 12/21/2015 Document Reviewed: 12/21/2015 Elsevier Interactive Patient Education  2019 Elsevier Inc.  Managing Your Hypertension Hypertension is commonly called high blood pressure. This is when  the force of your blood pressing against the walls of your arteries is too strong. Arteries are blood vessels that carry blood from your heart throughout your body. Hypertension forces the heart to work harder to pump blood, and may cause the arteries to become narrow or stiff. Having untreated or uncontrolled hypertension can cause heart attack, stroke, kidney disease, and other problems. What are blood pressure readings? A blood pressure reading consists of a higher number over a lower number. Ideally, your blood pressure should be below 120/80. The first ("top") number is called the systolic pressure. It is a measure of the pressure in your arteries as your heart beats. The second ("bottom") number is called the diastolic pressure. It is a measure of the pressure in your arteries as the heart relaxes. What does my blood pressure reading mean? Blood pressure is classified into four stages. Based on your blood pressure reading, your health care provider may use the following stages to determine what type of treatment you need, if any. Systolic pressure and diastolic pressure are measured in a unit called mm Hg. Normal  Systolic pressure: below 120.  Diastolic pressure: below 80. Elevated  Systolic pressure: 120-129.  Diastolic pressure: below 80. Hypertension stage 1  Systolic pressure: 130-139.  Diastolic pressure: 80-89. Hypertension stage 2  Systolic pressure: 140 or above.  Diastolic pressure: 90 or above. What health risks are associated with hypertension? Managing your hypertension is an important responsibility. Uncontrolled hypertension can lead to:  A heart attack.  A stroke.  A weakened blood vessel (aneurysm).  Heart failure.  Kidney damage.  Eye damage.  Metabolic syndrome.  Memory and concentration problems. What changes can I make to manage my hypertension? Hypertension can be managed by making lifestyle changes and possibly by taking medicines. Your health  care provider will help you make a plan to bring your blood pressure within a normal range. Eating and drinking   Eat a diet that is high in fiber and potassium, and low in salt (sodium), added sugar, and fat. An example eating plan is called the DASH (Dietary Approaches to Stop Hypertension) diet. To eat this way: ? Eat plenty of fresh fruits and vegetables. Try to fill half of your plate at each meal with fruits and vegetables. ? Eat whole grains, such as whole wheat pasta, brown rice, or whole grain bread. Fill about one quarter of your plate with whole grains. ? Eat low-fat diary products. ? Avoid fatty cuts of meat, processed or cured meats, and poultry with skin. Fill about one quarter of your plate with lean proteins such as fish, chicken without skin, beans, eggs, and tofu. ? Avoid premade and processed foods. These tend to be higher in sodium, added sugar, and fat.  Reduce your daily sodium intake. Most people with hypertension should eat less than 1,500 mg of sodium a day.  Limit alcohol intake to no more than 1 drink a day for nonpregnant women and 2 drinks a day for men. One drink equals 12 oz of beer, 5 oz of wine, or 1 oz of hard liquor. Lifestyle  Work with your health care   provider to maintain a healthy body weight, or to lose weight. Ask what an ideal weight is for you.  Get at least 30 minutes of exercise that causes your heart to beat faster (aerobic exercise) most days of the week. Activities may include walking, swimming, or biking.  Include exercise to strengthen your muscles (resistance exercise), such as weight lifting, as part of your weekly exercise routine. Try to do these types of exercises for 30 minutes at least 3 days a week.  Do not use any products that contain nicotine or tobacco, such as cigarettes and e-cigarettes. If you need help quitting, ask your health care provider.  Control any long-term (chronic) conditions you have, such as high cholesterol or  diabetes. Monitoring  Monitor your blood pressure at home as told by your health care provider. Your personal target blood pressure may vary depending on your medical conditions, your age, and other factors.  Have your blood pressure checked regularly, as often as told by your health care provider. Working with your health care provider  Review all the medicines you take with your health care provider because there may be side effects or interactions.  Talk with your health care provider about your diet, exercise habits, and other lifestyle factors that may be contributing to hypertension.  Visit your health care provider regularly. Your health care provider can help you create and adjust your plan for managing hypertension. Will I need medicine to control my blood pressure? Your health care provider may prescribe medicine if lifestyle changes are not enough to get your blood pressure under control, and if:  Your systolic blood pressure is 130 or higher.  Your diastolic blood pressure is 80 or higher. Take medicines only as told by your health care provider. Follow the directions carefully. Blood pressure medicines must be taken as prescribed. The medicine does not work as well when you skip doses. Skipping doses also puts you at risk for problems. Contact a health care provider if:  You think you are having a reaction to medicines you have taken.  You have repeated (recurrent) headaches.  You feel dizzy.  You have swelling in your ankles.  You have trouble with your vision. Get help right away if:  You develop a severe headache or confusion.  You have unusual weakness or numbness, or you feel faint.  You have severe pain in your chest or abdomen.  You vomit repeatedly.  You have trouble breathing. Summary  Hypertension is when the force of blood pumping through your arteries is too strong. If this condition is not controlled, it may put you at risk for serious  complications.  Your personal target blood pressure may vary depending on your medical conditions, your age, and other factors. For most people, a normal blood pressure is less than 120/80.  Hypertension is managed by lifestyle changes, medicines, or both. Lifestyle changes include weight loss, eating a healthy, low-sodium diet, exercising more, and limiting alcohol. This information is not intended to replace advice given to you by your health care provider. Make sure you discuss any questions you have with your health care provider. Document Released: 09/21/2011 Document Revised: 11/25/2015 Document Reviewed: 11/25/2015 Elsevier Interactive Patient Education  2019 Elsevier Inc.  

## 2018-03-12 LAB — CYTOLOGY - PAP
CHLAMYDIA, DNA PROBE: NEGATIVE
DIAGNOSIS: NEGATIVE
Neisseria Gonorrhea: NEGATIVE

## 2018-04-18 ENCOUNTER — Telehealth: Payer: Self-pay | Admitting: *Deleted

## 2018-04-18 NOTE — Telephone Encounter (Signed)
Called patient about tomorrows visit she requested to cancel appt.

## 2018-04-19 ENCOUNTER — Ambulatory Visit: Payer: Self-pay | Admitting: Adult Health

## 2018-05-06 ENCOUNTER — Encounter: Payer: Self-pay | Admitting: Adult Health

## 2018-05-06 ENCOUNTER — Encounter: Payer: Self-pay | Admitting: Obstetrics and Gynecology

## 2018-07-12 ENCOUNTER — Other Ambulatory Visit: Payer: Self-pay

## 2018-07-12 ENCOUNTER — Other Ambulatory Visit: Payer: Managed Care, Other (non HMO)

## 2018-07-12 DIAGNOSIS — Z20822 Contact with and (suspected) exposure to covid-19: Secondary | ICD-10-CM

## 2018-10-17 ENCOUNTER — Telehealth: Payer: Self-pay | Admitting: *Deleted

## 2018-10-17 NOTE — Telephone Encounter (Signed)
Pt reports that she took preg test yesterday and had 2 faint positive, she took them mid day. She is not late for her period yet but is having nausea and some cramping. Advised her to wait a few days and then do another urine pregnancy test using her first urine of the day. Patient is agreeable and will let us know. Informed her of our process for new ob and reasons to seek emergency care. No further questions or concerns at this time.

## 2019-01-05 ENCOUNTER — Other Ambulatory Visit: Payer: Self-pay

## 2019-01-05 ENCOUNTER — Ambulatory Visit
Admission: EM | Admit: 2019-01-05 | Discharge: 2019-01-05 | Disposition: A | Discharge status: Home / Self Care | Payer: No Typology Code available for payment source | Attending: Emergency Medicine | Admitting: Emergency Medicine

## 2019-01-05 DIAGNOSIS — T7840XA Allergy, unspecified, initial encounter: Secondary | ICD-10-CM

## 2019-01-05 DIAGNOSIS — L298 Other pruritus: Secondary | ICD-10-CM | POA: Diagnosis not present

## 2019-01-05 DIAGNOSIS — L500 Allergic urticaria: Secondary | ICD-10-CM | POA: Diagnosis not present

## 2019-01-05 DIAGNOSIS — K1329 Other disturbances of oral epithelium, including tongue: Secondary | ICD-10-CM | POA: Diagnosis not present

## 2019-01-05 DIAGNOSIS — R232 Flushing: Secondary | ICD-10-CM

## 2019-01-05 MED ORDER — DEXAMETHASONE SODIUM PHOSPHATE 10 MG/ML IJ SOLN
10.0000 mg | Freq: Once | INTRAMUSCULAR | Status: AC
  Administered 2019-01-05: 13:00:00 10 mg via INTRAMUSCULAR

## 2019-01-05 MED ORDER — FAMOTIDINE 20 MG PO TABS: 20.0000 mg | ORAL_TABLET | Freq: Two times a day (BID) | ORAL | 0 refills | Status: DC

## 2019-01-05 MED ORDER — FAMOTIDINE 20 MG PO TABS
20.0000 mg | ORAL_TABLET | Freq: Once | ORAL | Status: AC
  Administered 2019-01-05: 13:00:00 20 mg via ORAL

## 2019-01-05 MED ORDER — PREDNISONE 20 MG PO TABS: 40.0000 mg | ORAL_TABLET | Freq: Every day | ORAL | 0 refills | Status: AC

## 2019-01-05 MED ORDER — DIPHENHYDRAMINE HCL 12.5 MG/5ML PO ELIX
25.0000 mg | ORAL_SOLUTION | Freq: Once | ORAL | Status: AC
  Administered 2019-01-05: 13:00:00 25 mg via ORAL

## 2019-01-05 NOTE — ED Triage Notes (Signed)
Pt presents to UC w/ c/o swollen face, itchiness, and tingling lips 1 hr after taking a new medication for cyst.

## 2019-01-05 NOTE — Discharge Instructions (Signed)
Decadron 10 mg shot given in office Benadryl 25 mg given in office Famotidine 20 mg given in office.   Rest push fluids Return or follow up with PCP in 24 hours to be reevaluated and to ensure your symptoms are improving Prednisone 40 mg daily for 3 days prescribed.  Take as directed and to completion. Benadryl 25 mg prescribed.  Take as directed for 3 days. Pepcid 20 mg twice daily for 3 days Return sooner or go to the ED if you have any new or worsening symptoms such as difficulty breathing, shortness of breath, chest pain, nausea, vomiting, throat tightness or swelling, tongue swelling or tingling, worsening lip or facial swelling, abdominal pain, changes in bowel or bladder habits, no improvement despite medications, etc..Marland Kitchen

## 2019-01-05 NOTE — ED Provider Notes (Signed)
[place]   [id number] 01/05/19 Arrival Time: 1252  Cc: Allergic reaction  SUBJECTIVE:  ZAMZAM WHINERY is a 23 y.o. female who presents with possible allergic reaction that began a few hours ago.  Symptoms began after taking bactrim for possible infected cyst to LT axilla.  Seen by Dr. Nevada Crane this morning.  Reports feeling flushed, itching to bilateral feet, lips tingling, and hives.  Went to [facility] ED, and was told to wait.  Patient then left and came here for treatment.  Has NOT tried OTC medications with relief.  Denies aggravating factors.  Reports previous symptoms in the past related amoxicillin allergy.   Denies fever, chills, nausea, vomiting, erythema, swollen glands, throat swelling/ tingling, mouth swelling/ tingling, tongue swelling/tingling, dyspnea, SOB, chest pain, abdominal pain, changes in bowel or bladder function.     ROS: As per HPI.  All other pertinent ROS negative.     Past Medical History:  Diagnosis Date  . Headache   . Hypertension   . Panic attacks    Past Surgical History:  Procedure Laterality Date  . FOREIGN BODY REMOVAL Left 09/30/2015   Procedure: FOREIGN BODY REMOVAL ADULT;  Surgeon: Carole Civil, MD;  Location: [facility];  Service: Orthopedics;  Laterality: Left;  LEFT HAND  . WISDOM TOOTH EXTRACTION Bilateral    Allergies  Allergen Reactions  . Amoxicillin Hives  . Cefdinir Hives    Possible Patient is unsure  . Other Other (See Comments)    Citrus fruits. Tongue and throat burning.  . Smz-Tmp Ds [Sulfamethoxazole-Trimethoprim] Hives   No current facility-administered medications on file prior to encounter.   Current Outpatient Medications on File Prior to Encounter  Medication Sig Dispense Refill  . hydrochlorothiazide (HYDRODIURIL) 12.5 MG tablet TAKE 1 TABLET BY MOUTH ONCE DAILY FOR HYPERTENSION    . [DISCONTINUED] amLODipine (NORVASC) 5 MG tablet Take 1 tablet (5 mg total) by mouth daily. 30 tablet 6  . [DISCONTINUED]  norethindrone (MICRONOR,CAMILA,ERRIN) 0.35 MG tablet Take 1 tablet (0.35 mg total) by mouth daily. 1 Package 11    Social History   Socioeconomic History  . Marital status: Single    Spouse name: Not on file  . Number of children: Not on file  . Years of education: Not on file  . Highest education level: Not on file  Occupational History  . Not on file  Tobacco Use  . Smoking status: Never Smoker  . Smokeless tobacco: Never Used  Substance and Sexual Activity  . Alcohol use: No  . Drug use: No  . Sexual activity: Yes    Birth control/protection: Condom, None  Other Topics Concern  . Not on file  Social History Narrative  . Not on file   Social Determinants of Health   Financial Resource Strain:   . Difficulty of Paying Living Expenses: Not on file  Food Insecurity:   . Worried About Charity fundraiser in the Last Year: Not on file  . Ran Out of Food in the Last Year: Not on file  Transportation Needs:   . Lack of Transportation (Medical): Not on file  . Lack of Transportation (Non-Medical): Not on file  Physical Activity:   . Days of Exercise per Week: Not on file  . Minutes of Exercise per Session: Not on file  Stress:   . Feeling of Stress : Not on file  Social Connections:   . Frequency of Communication with Friends and Family: Not on file  . Frequency of Social  Gatherings with Friends and Family: Not on file  . Attends Religious Services: Not on file  . Active Member of Clubs or Organizations: Not on file  . Attends Banker Meetings: Not on file  . Marital Status: Not on file  Intimate Partner Violence:   . Fear of Current or Ex-Partner: Not on file  . Emotionally Abused: Not on file  . Physically Abused: Not on file  . Sexually Abused: Not on file   Family History  Problem Relation Age of Onset  . Hypertension Mother   . Hypertension Father   . Cancer Paternal Grandfather        lung  . Cancer Paternal Grandmother        lung  .  Hypertension Maternal Grandmother   . Alcohol abuse Maternal Grandfather      OBJECTIVE:  Vitals:   01/05/19 1305  BP: 130/78  Pulse: (!) 116  Resp: 18  Temp: 98.4 F (36.9 C)  TempSrc: Oral  SpO2: 99%     General appearance: Alert, speaking in full sentences without difficulty HEENT:NCAT; no obvious facial swelling; Ears: EACs clear, TMs pearly gray; Eyes: PERRL.  EOM grossly intact. Nose: nares patent without rhinorrhea; Throat: tonsils nonerythematous or enlarged, uvula midline, tolerating own secretions without difficulty Neck: supple without LAD Lungs: clear to auscultation bilaterally without adventitious breath sounds; normal respiratory effort; no labored respirations Heart: regular rate and rhythm.   Skin: warm and dry Psychological: alert and cooperative; normal mood and affect  ASSESSMENT & PLAN:  1. Allergic reaction, initial encounter     Meds ordered this encounter  Medications  . dexamethasone (DECADRON) injection 10 mg  . diphenhydrAMINE (BENADRYL) 12.5 MG/5ML elixir 25 mg  . famotidine (PEPCID) tablet 20 mg  . predniSONE (DELTASONE) 20 MG tablet    Sig: Take 2 tablets (40 mg total) by mouth daily for 3 days.    Dispense:  6 tablet    Refill:  0    Order Specific Question:   Supervising Provider    Answer:   Eustace Moore [[id number]]  . famotidine (PEPCID) 20 MG tablet    Sig: Take 1 tablet (20 mg total) by mouth 2 (two) times daily for 3 days.    Dispense:  6 tablet    Refill:  0    Order Specific Question:   Supervising Provider    Answer:   Eustace Moore [[id number]]   Decadron 10 mg shot given in office Benadryl 25 mg given in office Famotidine 20 mg given in office.   Rest push fluids Return or follow up with PCP in 24 hours to be reevaluated and to ensure your symptoms are improving Prednisone 40 mg daily for 3 days prescribed.  Take as directed and to completion. Benadryl 25 mg prescribed.  Take as directed for 3 days. Pepcid 20 mg  twice daily for 3 days Return sooner or go to the ED if you have any new or worsening symptoms such as difficulty breathing, shortness of breath, chest pain, nausea, vomiting, throat tightness or swelling, tongue swelling or tingling, worsening lip or facial swelling, abdominal pain, changes in bowel or bladder habits, no improvement despite medications, etc...   Reviewed expectations re: course of current medical issues. Questions answered. Outlined signs and symptoms indicating need for more acute intervention. Patient verbalized understanding. After Visit Summary given.          Rennis Harding, PA-C 01/05/19 1334

## 2019-01-22 ENCOUNTER — Other Ambulatory Visit: Payer: Self-pay

## 2019-01-22 ENCOUNTER — Encounter: Payer: Self-pay | Admitting: General Surgery

## 2019-01-22 ENCOUNTER — Ambulatory Visit (INDEPENDENT_AMBULATORY_CARE_PROVIDER_SITE_OTHER): Payer: No Typology Code available for payment source | Admitting: General Surgery

## 2019-01-22 VITALS — BP 152/100 | HR 85 | Temp 99.0°F | Resp 16 | Ht 65.0 in | Wt 187.0 lb

## 2019-01-22 DIAGNOSIS — L723 Sebaceous cyst: Secondary | ICD-10-CM | POA: Diagnosis not present

## 2019-01-22 NOTE — Patient Instructions (Signed)
Epidermal/ Sebaceous Cyst  An epidermal cyst is a sac made of skin tissue. The sac contains a substance called keratin. Keratin is a protein that is normally secreted through the hair follicles. When keratin becomes trapped in the top layer of skin (epidermis), it can form an epidermal cyst. Epidermal cysts can be found anywhere on your body. These cysts are usually harmless (benign), and they may not cause symptoms unless they become infected. What are the causes? This condition may be caused by:  A blocked hair follicle.  A hair that curls and re-enters the skin instead of growing straight out of the skin (ingrown hair).  A blocked pore.  Irritated skin.  An injury to the skin.  Certain conditions that are passed along from parent to child (inherited).  Human papillomavirus (HPV).  Long-term (chronic) sun damage to the skin. What increases the risk? The following factors may make you more likely to develop an epidermal cyst:  Having acne.  Being overweight.  Being 30-40 years old. What are the signs or symptoms? The only symptom of this condition may be a small, painless lump underneath the skin. When an epidermal cyst ruptures, it may become infected. Symptoms may include:  Redness.  Inflammation.  Tenderness.  Warmth.  Fever.  Keratin draining from the cyst. Keratin is grayish-white, bad-smelling substance.  Pus draining from the cyst. How is this diagnosed? This condition is diagnosed with a physical exam.  In some cases, you may have a sample of tissue (biopsy) taken from your cyst to be examined under a microscope or tested for bacteria.  You may be referred to a health care provider who specializes in skin care (dermatologist). How is this treated? In many cases, epidermal cysts go away on their own without treatment. If a cyst becomes infected, treatment may include:  Opening and draining the cyst, done by a health care provider. After draining, minor  surgery to remove the rest of the cyst may be done.  Antibiotic medicine.  Injections of medicines (steroids) that help to reduce inflammation.  Surgery to remove the cyst. Surgery may be done if the cyst: ? Becomes large. ? Bothers you. ? Has a chance of turning into cancer.  Do not try to open a cyst yourself. Follow these instructions at home:  Take over-the-counter and prescription medicines only as told by your health care provider.  If you were prescribed an antibiotic medicine, take it it as told by your health care provider. Do not stop using the antibiotic even if you start to feel better.  Keep the area around your cyst clean and dry.  Wear loose, dry clothing.  Avoid touching your cyst.  Check your cyst every day for signs of infection. Check for: ? Redness, swelling, or pain. ? Fluid or blood. ? Warmth. ? Pus or a bad smell.  Keep all follow-up visits as told by your health care provider. This is important. How is this prevented?  Wear clean, dry, clothing.  Avoid wearing tight clothing.  Keep your skin clean and dry. Take showers or baths every day. Contact a health care provider if:  Your cyst develops symptoms of infection.  Your condition is not improving or is getting worse.  You develop a cyst that looks different from other cysts you have had.  You have a fever. Get help right away if:  Redness spreads from the cyst into the surrounding area. Summary  An epidermal cyst is a sac made of skin tissue. These cysts   are usually harmless (benign), and they may not cause symptoms unless they become infected.  If a cyst becomes infected, treatment may include surgery to open and drain the cyst, or to remove it. Treatment may also include medicines by mouth or through an injection.  Take over-the-counter and prescription medicines only as told by your health care provider. If you were prescribed an antibiotic medicine, take it as told by your health  care provider. Do not stop using the antibiotic even if you start to feel better.  Contact a health care provider if your condition is not improving or is getting worse.  Keep all follow-up visits as told by your health care provider. This is important. This information is not intended to replace advice given to you by your health care provider. Make sure you discuss any questions you have with your health care provider. Document Revised: 04/19/2018 Document Reviewed: 07/10/2017 Elsevier Patient Education  2020 Elsevier Inc.  

## 2019-01-22 NOTE — Progress Notes (Signed)
[facility] History and Physical  Reason for Referral: Left axilla cyst  Referring Physician: Hall, John Z, MD     Chief Complaint     Abscess      Elaine Hernandez is a 24 y.o. female.  HPI: Ms. Hendry is a very sweet 24 yo who reports having swelling and pain in her left axilla that has been treated with antibiotics twice. She says the area has been there for a while, and that she went to her PCP to see if it needed drainage but given the location, they offered antibiotics and referred her to surgery. She says that her mom has some hidradenitis. She has never had any other areas that have had abscess or infection. She says that this area has never been drained. She denies any drainage or current fever. She started noticing this flare about 1-2 weeks ago. She wants to get it removed so that this will not continue to happen.      Past Medical History:  Diagnosis Date  . Headache   . Hypertension   . Panic attacks         Past Surgical History:  Procedure Laterality Date  . FOREIGN BODY REMOVAL Left 09/30/2015   Procedure: FOREIGN BODY REMOVAL ADULT; Surgeon: Stanley E Harrison, MD; Location: [facility]; Service: Orthopedics; Laterality: Left; LEFT HAND  . WISDOM TOOTH EXTRACTION Bilateral         Family History  Problem Relation Age of Onset  . Hypertension Mother   . Hypertension Father   . Cancer Paternal Grandfather    lung  . Cancer Paternal Grandmother    lung  . Hypertension Maternal Grandmother   . Alcohol abuse Maternal Grandfather    Social History       Tobacco Use  . Smoking status: Never Smoker  . Smokeless tobacco: Never Used  Substance Use Topics  . Alcohol use: No  . Drug use: No   Medications: I have reviewed the patient's current medications.       Allergies as of 01/22/2019      Reactions   Amoxicillin Hives   Cefdinir Hives   Possible Patient is unsure   Other Other (See Comments)   Citrus fruits. Tongue and throat burning.   Smz-tmp Ds [sulfamethoxazole-trimethoprim] Hives           Medication List       Accurate as of January 22, 2019 10:29 AM. If you have any questions, ask your nurse or doctor.        famotidine 20 MG tablet  Commonly known as: PEPCID  Take 1 tablet (20 mg total) by mouth 2 (two) times daily for 3 days.   hydrochlorothiazide 12.5 MG tablet  Commonly known as: HYDRODIURIL  TAKE 1 TABLET BY MOUTH ONCE DAILY FOR HYPERTENSION       ROS:  A comprehensive review of systems was negative except for: Integument/breast: positive for eczema  Blood pressure (!) 152/100, pulse 85, temperature 99 F (37.2 C), temperature source Oral, resp. rate 16, height 5' 5" (1.651 m), weight 187 lb (84.8 kg), SpO2 95 %.  Physical Exam  Vitals reviewed.  Constitutional:  Appearance: Normal appearance.  HENT:  Head: Normocephalic and atraumatic.  Nose: Nose normal.  Mouth/Throat:  Mouth: Mucous membranes are moist.  Eyes:  Extraocular Movements: Extraocular movements intact.  Pupils: Pupils are equal, round, and reactive to light.  Cardiovascular:  Rate and Rhythm: Normal rate and regular rhythm.  Pulmonary:  Effort: Pulmonary effort   is normal.  Breath sounds: Normal breath sounds.  Abdominal:  General: There is no distension.  Palpations: Abdomen is soft.  Tenderness: There is no abdominal tenderness.  Musculoskeletal:  General: No swelling. Normal range of motion.  Cervical back: Normal range of motion. No rigidity.  Lymphadenopathy:  Cervical: No cervical adenopathy.  Upper Body:  Right upper body: No supraclavicular or axillary adenopathy.  Comments: Left axilla with inferior fluctuant area, no redness and no signs of drainage, consistent with likely cyst  Skin:  General: Skin is warm and dry.  Neurological:  General: No focal deficit present.  Mental Status: She is alert and oriented to person, place, and time.  Psychiatric:  Mood and Affect: Mood normal.  Behavior: Behavior  normal.  Thought Content: Thought content normal.  Judgment: Judgment normal.   Results:  None  Assessment & Plan:  Elaine Hernandez is a 24 y.o. female with what seems to be a cyst in the left axilla that has been infected in the past. This area is bothersome to her and she is also worried about any possibility of cancer. I have told her this is very unlikely.  -Excision of cyst from left axilla  -Discussed risk of bleeding, infection, recurrence, lymphedema although rare due to superficial nature, pathology returning as something other than a cyst  All questions were answered to the satisfaction of the patient.  Nik Gorrell C Ellaree Gear  01/22/2019, 10:29 AM   

## 2019-01-23 ENCOUNTER — Encounter (HOSPITAL_COMMUNITY): Payer: Self-pay

## 2019-01-23 ENCOUNTER — Telehealth: Payer: Self-pay | Admitting: General Surgery

## 2019-01-23 ENCOUNTER — Encounter (HOSPITAL_COMMUNITY)
Admission: RE | Admit: 2019-01-23 | Discharge: 2019-01-23 | Disposition: A | Discharge status: Home / Self Care | Payer: No Typology Code available for payment source | Source: Ambulatory Visit | Attending: General Surgery | Admitting: General Surgery

## 2019-01-23 ENCOUNTER — Other Ambulatory Visit: Payer: Self-pay

## 2019-01-23 ENCOUNTER — Other Ambulatory Visit (HOSPITAL_COMMUNITY)
Admission: RE | Admit: 2019-01-23 | Discharge: 2019-01-23 | Disposition: A | Discharge status: Home / Self Care | Payer: No Typology Code available for payment source | Source: Ambulatory Visit | Attending: General Surgery | Admitting: General Surgery

## 2019-01-23 DIAGNOSIS — Z01812 Encounter for preprocedural laboratory examination: Secondary | ICD-10-CM | POA: Diagnosis present

## 2019-01-23 HISTORY — DX: Other specified postprocedural states: Z98.890

## 2019-01-23 HISTORY — DX: Nausea with vomiting, unspecified: R11.2

## 2019-01-23 LAB — BASIC METABOLIC PANEL
Anion gap: 11 (ref 5–15)
BUN: 12 mg/dL (ref 6–20)
CO2: 24 mmol/L (ref 22–32)
Calcium: 9 mg/dL (ref 8.9–10.3)
Chloride: 102 mmol/L (ref 98–111)
Creatinine, Ser: 0.62 mg/dL (ref 0.44–1.00)
GFR calc Af Amer: >60 mL/min (ref >60)
GFR calc non Af Amer: >60 mL/min (ref >60)
Glucose, Bld: 114 mg/dL — ABNORMAL HIGH (ref 70–99)
Potassium: 2.7 mmol/L — CL (ref 3.5–5.1)
Sodium: 137 mmol/L (ref 135–145)

## 2019-01-23 LAB — HCG, SERUM, QUALITATIVE: Preg, Serum: NEGATIVE

## 2019-01-23 LAB — SARS CORONAVIRUS 2 (TAT 6-24 HRS): SARS Coronavirus 2: NEGATIVE

## 2019-01-23 MED ORDER — POTASSIUM CHLORIDE ER 10 MEQ PO TBCR: 20.0000 meq | EXTENDED_RELEASE_TABLET | Freq: Every day | ORAL | 0 refills | Status: DC

## 2019-01-23 NOTE — Progress Notes (Signed)
Dr Henreitta Leber notified of K 2.7

## 2019-01-23 NOTE — Telephone Encounter (Signed)
[facility]  Potassium low and takes HCTZ. Prescribing 20 meq for next few days. Discussed if this is really expensive that she can see if the pharmacist recommends something over the counter versus eating sweet potato and bananas for the next two days.   Expressed understanding. Repeat AM of surgery.   Algis Greenhouse, MD [facility] [address]ella R[address] [place], [place] [address] [phone]/ [phone] (office)

## 2019-01-23 NOTE — Patient Instructions (Signed)
Your procedure is scheduled on: 01/25/2019  Report to [facility] at  10:30   AM.  Call this number if you have problems the morning of surgery: [phone]   Remember:   Do not Eat or Drink after midnight   :  Take these medicines the morning of surgery with A SIP OF WATER: Pepcid   Do not wear jewelry, make-up or nail polish.  Do not wear lotions, powders, or perfumes. You may wear deodorant.  Do not shave 48 hours prior to surgery. Men may shave face and neck.  Do not bring valuables to the hospital.  Contacts, dentures or bridgework may not be worn into surgery.  Leave suitcase in the car. After surgery it may be brought to your room.  For patients admitted to the hospital, checkout time is 11:00 AM the day of discharge.   Patients discharged the day of surgery will not be allowed to drive home.    Special Instructions: Shower using CHG night before surgery and shower the day of surgery use CHG.  Use special wash - you have one bottle of CHG for all showers.  You should use approximately 1/2 of the bottle for each shower.  Wound Care, Adult Taking care of your wound properly can help to prevent pain, infection, and scarring. It can also help your wound to heal more quickly. How to care for your wound Wound care      Follow instructions from your health care provider about how to take care of your wound. Make sure you: ? Wash your hands with soap and water before you change the bandage (dressing). If soap and water are not available, use hand sanitizer. ? Change your dressing as told by your health care provider. ? Leave stitches (sutures), skin glue, or adhesive strips in place. These skin closures may need to stay in place for 2 weeks or longer. If adhesive strip edges start to loosen and curl up, you may trim the loose edges. Do not remove adhesive strips completely unless your health care provider tells you to do that.  Check your wound area every day for signs of  infection. Check for: ? Redness, swelling, or pain. ? Fluid or blood. ? Warmth. ? Pus or a bad smell.  Ask your health care provider if you should clean the wound with mild soap and water. Doing this may include: ? Using a clean towel to pat the wound dry after cleaning it. Do not rub or scrub the wound. ? Applying a cream or ointment. Do this only as told by your health care provider. ? Covering the incision with a clean dressing.  Ask your health care provider when you can leave the wound uncovered.  Keep the dressing dry until your health care provider says it can be removed. Do not take baths, swim, use a hot tub, or do anything that would put the wound underwater until your health care provider approves. Ask your health care provider if you can take showers. You may only be allowed to take sponge baths. Medicines   If you were prescribed an antibiotic medicine, cream, or ointment, take or use the antibiotic as told by your health care provider. Do not stop taking or using the antibiotic even if your condition improves.  Take over-the-counter and prescription medicines only as told by your health care provider. If you were prescribed pain medicine, take it 30 or more minutes before you do any wound care or as told by your  health care provider. General instructions  Return to your normal activities as told by your health care provider. Ask your health care provider what activities are safe.  Do not scratch or pick at the wound.  Do not use any products that contain nicotine or tobacco, such as cigarettes and e-cigarettes. These may delay wound healing. If you need help quitting, ask your health care provider.  Keep all follow-up visits as told by your health care provider. This is important.  Eat a diet that includes protein, vitamin A, vitamin C, and other nutrient-rich foods to help the wound heal. ? Foods rich in protein include meat, dairy, beans, nuts, and other  sources. ? Foods rich in vitamin A include carrots and dark green, leafy vegetables. ? Foods rich in vitamin C include citrus, tomatoes, and other fruits and vegetables. ? Nutrient-rich foods have protein, carbohydrates, fat, vitamins, or minerals. Eat a variety of healthy foods including vegetables, fruits, and whole grains. Contact a health care provider if:  You received a tetanus shot and you have swelling, severe pain, redness, or bleeding at the injection site.  Your pain is not controlled with medicine.  You have redness, swelling, or pain around the wound.  You have fluid or blood coming from the wound.  Your wound feels warm to the touch.  You have pus or a bad smell coming from the wound.  You have a fever or chills.  You are nauseous or you vomit.  You are dizzy. Get help right away if:  You have a red streak going away from your wound.  The edges of the wound open up and separate.  Your wound is bleeding, and the bleeding does not stop with gentle pressure.  You have a rash.  You faint.  You have trouble breathing. Summary  Always wash your hands with soap and water before changing your bandage (dressing).  To help with healing, eat foods that are rich in protein, vitamin A, vitamin C, and other nutrients.  Check your wound every day for signs of infection. Contact your health care provider if you suspect that your wound is infected. This information is not intended to replace advice given to you by your health care provider. Make sure you discuss any questions you have with your health care provider. Document Revised: 04/16/2018 Document Reviewed: 07/14/2015 Elsevier Patient Education  2020 Elsevier Inc.  General Anesthesia, Adult, Care After This sheet gives you information about how to care for yourself after your procedure. Your health care provider may also give you more specific instructions. If you have problems or questions, contact your health  care provider. What can I expect after the procedure? After the procedure, the following side effects are common:  Pain or discomfort at the IV site.  Nausea.  Vomiting.  Sore throat.  Trouble concentrating.  Feeling cold or chills.  Weak or tired.  Sleepiness and fatigue.  Soreness and body aches. These side effects can affect parts of the body that were not involved in surgery. Follow these instructions at home:  For at least 24 hours after the procedure:  Have a responsible adult stay with you. It is important to have someone help care for you until you are awake and alert.  Rest as needed.  Do not: ? Participate in activities in which you could fall or become injured. ? Drive. ? Use heavy machinery. ? Drink alcohol. ? Take sleeping pills or medicines that cause drowsiness. ? Make important decisions or sign legal  documents. ? Take care of children on your own. Eating and drinking  Follow any instructions from your health care provider about eating or drinking restrictions.  When you feel hungry, start by eating small amounts of foods that are soft and easy to digest (bland), such as toast. Gradually return to your regular diet.  Drink enough fluid to keep your urine pale yellow.  If you vomit, rehydrate by drinking water, juice, or clear broth. General instructions  If you have sleep apnea, surgery and certain medicines can increase your risk for breathing problems. Follow instructions from your health care provider about wearing your sleep device: ? Anytime you are sleeping, including during daytime naps. ? While taking prescription pain medicines, sleeping medicines, or medicines that make you drowsy.  Return to your normal activities as told by your health care provider. Ask your health care provider what activities are safe for you.  Take over-the-counter and prescription medicines only as told by your health care provider.  If you smoke, do not smoke  without supervision.  Keep all follow-up visits as told by your health care provider. This is important. Contact a health care provider if:  You have nausea or vomiting that does not get better with medicine.  You cannot eat or drink without vomiting.  You have pain that does not get better with medicine.  You are unable to pass urine.  You develop a skin rash.  You have a fever.  You have redness around your IV site that gets worse. Get help right away if:  You have difficulty breathing.  You have chest pain.  You have blood in your urine or stool, or you vomit blood. Summary  After the procedure, it is common to have a sore throat or nausea. It is also common to feel tired.  Have a responsible adult stay with you for the first 24 hours after general anesthesia. It is important to have someone help care for you until you are awake and alert.  When you feel hungry, start by eating small amounts of foods that are soft and easy to digest (bland), such as toast. Gradually return to your regular diet.  Drink enough fluid to keep your urine pale yellow.  Return to your normal activities as told by your health care provider. Ask your health care provider what activities are safe for you. This information is not intended to replace advice given to you by your health care provider. Make sure you discuss any questions you have with your health care provider. Document Revised: 12/30/2016 Document Reviewed: 08/12/2016 Elsevier Patient Education  [employer].

## 2019-01-24 ENCOUNTER — Ambulatory Visit: Payer: No Typology Code available for payment source | Admitting: General Surgery

## 2019-01-24 NOTE — H&P (Signed)
[facility] History and Physical  Reason for Referral: Left axilla cyst  Referring Physician: Celene Squibb, MD     Chief Complaint     Abscess      Elaine Hernandez is a 24 y.o. female.  HPI: Elaine Hernandez is a very sweet 24 yo who reports having swelling and pain in her left axilla that has been treated with antibiotics twice. She says the area has been there for a while, and that she went to her PCP to see if it needed drainage but given the location, they offered antibiotics and referred her to surgery. She says that her mom has some hidradenitis. She has never had any other areas that have had abscess or infection. She says that this area has never been drained. She denies any drainage or current fever. She started noticing this flare about 1-2 weeks ago. She wants to get it removed so that this will not continue to happen.      Past Medical History:  Diagnosis Date  . Headache   . Hypertension   . Panic attacks         Past Surgical History:  Procedure Laterality Date  . FOREIGN BODY REMOVAL Left 09/30/2015   Procedure: FOREIGN BODY REMOVAL ADULT; Surgeon: Carole Civil, MD; Location: [facility]; Service: Orthopedics; Laterality: Left; LEFT HAND  . WISDOM TOOTH EXTRACTION Bilateral         Family History  Problem Relation Age of Onset  . Hypertension Mother   . Hypertension Father   . Cancer Paternal Grandfather    lung  . Cancer Paternal Grandmother    lung  . Hypertension Maternal Grandmother   . Alcohol abuse Maternal Grandfather    Social History       Tobacco Use  . Smoking status: Never Smoker  . Smokeless tobacco: Never Used  Substance Use Topics  . Alcohol use: No  . Drug use: No   Medications: I have reviewed the patient's current medications.       Allergies as of 01/22/2019      Reactions   Amoxicillin Hives   Cefdinir Hives   Possible Patient is unsure   Other Other (See Comments)   Citrus fruits. Tongue and throat burning.   Smz-tmp Ds [sulfamethoxazole-trimethoprim] Hives           Medication List       Accurate as of January 22, 2019 10:29 AM. If you have any questions, ask your nurse or doctor.        famotidine 20 MG tablet  Commonly known as: PEPCID  Take 1 tablet (20 mg total) by mouth 2 (two) times daily for 3 days.   hydrochlorothiazide 12.5 MG tablet  Commonly known as: HYDRODIURIL  TAKE 1 TABLET BY MOUTH ONCE DAILY FOR HYPERTENSION       ROS:  A comprehensive review of systems was negative except for: Integument/breast: positive for eczema  Blood pressure (!) 152/100, pulse 85, temperature 99 F (37.2 C), temperature source Oral, resp. rate 16, height 5\' 5"  (1.651 m), weight 187 lb (84.8 kg), SpO2 95 %.  Physical Exam  Vitals reviewed.  Constitutional:  Appearance: Normal appearance.  HENT:  Head: Normocephalic and atraumatic.  Nose: Nose normal.  Mouth/Throat:  Mouth: Mucous membranes are moist.  Eyes:  Extraocular Movements: Extraocular movements intact.  Pupils: Pupils are equal, round, and reactive to light.  Cardiovascular:  Rate and Rhythm: Normal rate and regular rhythm.  Pulmonary:  Effort: Pulmonary effort  is normal.  Breath sounds: Normal breath sounds.  Abdominal:  General: There is no distension.  Palpations: Abdomen is soft.  Tenderness: There is no abdominal tenderness.  Musculoskeletal:  General: No swelling. Normal range of motion.  Cervical back: Normal range of motion. No rigidity.  Lymphadenopathy:  Cervical: No cervical adenopathy.  Upper Body:  Right upper body: No supraclavicular or axillary adenopathy.  Comments: Left axilla with inferior fluctuant area, no redness and no signs of drainage, consistent with likely cyst  Skin:  General: Skin is warm and dry.  Neurological:  General: No focal deficit present.  Mental Status: She is alert and oriented to person, place, and time.  Psychiatric:  Mood and Affect: Mood normal.  Behavior: Behavior  normal.  Thought Content: Thought content normal.  Judgment: Judgment normal.   Results:  None  Assessment & Plan:  Elaine Hernandez is a 24 y.o. female with what seems to be a cyst in the left axilla that has been infected in the past. This area is bothersome to her and she is also worried about any possibility of cancer. I have told her this is very unlikely.  -Excision of cyst from left axilla  -Discussed risk of bleeding, infection, recurrence, lymphedema although rare due to superficial nature, pathology returning as something other than a cyst  All questions were answered to the satisfaction of the patient.  Lucretia Roers  01/22/2019, 10:29 AM

## 2019-01-25 ENCOUNTER — Ambulatory Visit (HOSPITAL_COMMUNITY): Payer: No Typology Code available for payment source | Admitting: Anesthesiology

## 2019-01-25 ENCOUNTER — Ambulatory Visit (HOSPITAL_COMMUNITY)
Admission: RE | Admit: 2019-01-25 | Discharge: 2019-01-25 | Disposition: A | Discharge status: Home / Self Care | Payer: No Typology Code available for payment source | Attending: General Surgery | Admitting: General Surgery

## 2019-01-25 ENCOUNTER — Encounter (HOSPITAL_COMMUNITY)
Admission: RE | Disposition: A | Discharge status: Home / Self Care | Payer: Self-pay | Source: Home / Self Care | Attending: General Surgery

## 2019-01-25 ENCOUNTER — Encounter (HOSPITAL_COMMUNITY): Payer: Self-pay | Admitting: General Surgery

## 2019-01-25 DIAGNOSIS — Z8249 Family history of ischemic heart disease and other diseases of the circulatory system: Secondary | ICD-10-CM | POA: Diagnosis not present

## 2019-01-25 DIAGNOSIS — I10 Essential (primary) hypertension: Secondary | ICD-10-CM | POA: Insufficient documentation

## 2019-01-25 DIAGNOSIS — Z881 Allergy status to other antibiotic agents status: Secondary | ICD-10-CM | POA: Insufficient documentation

## 2019-01-25 DIAGNOSIS — L089 Local infection of the skin and subcutaneous tissue, unspecified: Secondary | ICD-10-CM | POA: Diagnosis not present

## 2019-01-25 DIAGNOSIS — L723 Sebaceous cyst: Secondary | ICD-10-CM | POA: Diagnosis present

## 2019-01-25 DIAGNOSIS — Z880 Allergy status to penicillin: Secondary | ICD-10-CM | POA: Diagnosis not present

## 2019-01-25 HISTORY — PX: CYST EXCISION: SHX5701

## 2019-01-25 LAB — POCT I-STAT, CHEM 8
BUN: 12 mg/dL (ref 6–20)
Calcium, Ion: 1.17 mmol/L (ref 1.15–1.40)
Chloride: 102 mmol/L (ref 98–111)
Creatinine, Ser: 0.6 mg/dL (ref 0.44–1.00)
Glucose, Bld: 87 mg/dL (ref 70–99)
HCT: 39 % (ref 36.0–46.0)
Hemoglobin: 13.3 g/dL (ref 12.0–15.0)
Potassium: 3.8 mmol/L (ref 3.5–5.1)
Sodium: 139 mmol/L (ref 135–145)
TCO2: 29 mmol/L (ref 22–32)

## 2019-01-25 SURGERY — CYST REMOVAL
Anesthesia: General | Site: Axilla | Laterality: Left

## 2019-01-25 MED ORDER — HYDROCODONE-ACETAMINOPHEN 7.5-325 MG PO TABS: 1.0000 | ORAL_TABLET | Freq: Once | ORAL | Status: DC | PRN

## 2019-01-25 MED ORDER — BUPIVACAINE HCL (PF) 0.5 % IJ SOLN
INTRAMUSCULAR | Status: DC | PRN
  Administered 2019-01-25: 10 mL

## 2019-01-25 MED ORDER — PROMETHAZINE HCL 25 MG/ML IJ SOLN: 6.2500 mg | INTRAMUSCULAR | Status: DC | PRN

## 2019-01-25 MED ORDER — ONDANSETRON HCL 4 MG/2ML IJ SOLN
INTRAMUSCULAR | Status: DC | PRN
  Administered 2019-01-25: 4 mg via INTRAVENOUS

## 2019-01-25 MED ORDER — FENTANYL CITRATE (PF) 100 MCG/2ML IJ SOLN
INTRAMUSCULAR | Status: DC | PRN
  Administered 2019-01-25: 50 ug via INTRAVENOUS
  Administered 2019-01-25 (×2): 25 ug via INTRAVENOUS
  Administered 2019-01-25: 50 ug via INTRAVENOUS

## 2019-01-25 MED ORDER — ONDANSETRON HCL 4 MG/2ML IJ SOLN
INTRAMUSCULAR | Status: AC
  Filled 2019-01-25: qty 2

## 2019-01-25 MED ORDER — FENTANYL CITRATE (PF) 250 MCG/5ML IJ SOLN
INTRAMUSCULAR | Status: AC
  Filled 2019-01-25: qty 5

## 2019-01-25 MED ORDER — PROPOFOL 10 MG/ML IV BOLUS
INTRAVENOUS | Status: DC | PRN
  Administered 2019-01-25: 200 mg via INTRAVENOUS

## 2019-01-25 MED ORDER — LACTATED RINGERS IV SOLN
INTRAVENOUS | Status: DC
  Administered 2019-01-25: 1000 mL via INTRAVENOUS

## 2019-01-25 MED ORDER — MIDAZOLAM HCL 5 MG/5ML IJ SOLN
INTRAMUSCULAR | Status: DC | PRN
  Administered 2019-01-25: 2 mg via INTRAVENOUS

## 2019-01-25 MED ORDER — BUPIVACAINE HCL (PF) 0.5 % IJ SOLN
INTRAMUSCULAR | Status: AC
  Filled 2019-01-25: qty 30

## 2019-01-25 MED ORDER — SODIUM CHLORIDE 0.9 % IR SOLN
Status: DC | PRN
  Administered 2019-01-25: 1000 mL

## 2019-01-25 MED ORDER — DOCUSATE SODIUM 100 MG PO CAPS: 100.0000 mg | ORAL_CAPSULE | Freq: Two times a day (BID) | ORAL | 2 refills | Status: DC

## 2019-01-25 MED ORDER — MIDAZOLAM HCL 2 MG/2ML IJ SOLN
INTRAMUSCULAR | Status: AC
  Filled 2019-01-25: qty 2

## 2019-01-25 MED ORDER — HYDROMORPHONE HCL 1 MG/ML IJ SOLN: 0.2500 mg | INTRAMUSCULAR | Status: DC | PRN

## 2019-01-25 MED ORDER — MIDAZOLAM HCL 2 MG/2ML IJ SOLN: 0.5000 mg | Freq: Once | INTRAMUSCULAR | Status: DC | PRN

## 2019-01-25 MED ORDER — PROPOFOL 10 MG/ML IV BOLUS
INTRAVENOUS | Status: AC
  Filled 2019-01-25: qty 20

## 2019-01-25 MED ORDER — CIPROFLOXACIN IN D5W 400 MG/200ML IV SOLN
400.0000 mg | INTRAVENOUS | Status: AC
  Administered 2019-01-25: 400 mg via INTRAVENOUS
  Filled 2019-01-25: qty 200

## 2019-01-25 MED ORDER — OXYCODONE HCL 5 MG PO TABS: 5.0000 mg | ORAL_TABLET | ORAL | 0 refills | Status: DC | PRN

## 2019-01-25 MED ORDER — ONDANSETRON HCL 4 MG PO TABS: 4.0000 mg | ORAL_TABLET | Freq: Every day | ORAL | 1 refills | Status: DC | PRN

## 2019-01-25 MED ORDER — CHLORHEXIDINE GLUCONATE CLOTH 2 % EX PADS: 6.0000 | MEDICATED_PAD | Freq: Once | CUTANEOUS | Status: DC

## 2019-01-25 MED ORDER — LIDOCAINE HCL (CARDIAC) PF 50 MG/5ML IV SOSY
PREFILLED_SYRINGE | INTRAVENOUS | Status: DC | PRN
  Administered 2019-01-25: 50 mg via INTRAVENOUS

## 2019-01-25 SURGICAL SUPPLY — 34 items
ADH SKN CLS APL DERMABOND .7 (GAUZE/BANDAGES/DRESSINGS) ×1
CLOTH BEACON ORANGE TIMEOUT ST (SAFETY) ×2 IMPLANT
COVER LIGHT HANDLE STERIS (MISCELLANEOUS) ×4 IMPLANT
COVER WAND RF STERILE (DRAPES) ×2 IMPLANT
DECANTER SPIKE VIAL GLASS SM (MISCELLANEOUS) ×2 IMPLANT
DERMABOND ADVANCED (GAUZE/BANDAGES/DRESSINGS) ×1
DERMABOND ADVANCED .7 DNX12 (GAUZE/BANDAGES/DRESSINGS) IMPLANT
ELECT REM PT RETURN 9FT ADLT (ELECTROSURGICAL) ×2
ELECTRODE REM PT RTRN 9FT ADLT (ELECTROSURGICAL) ×1 IMPLANT
GAUZE KERLIX 2X3 DERM STRL LF (GAUZE/BANDAGES/DRESSINGS) ×1 IMPLANT
GAUZE SPONGE 4X4 12PLY STRL (GAUZE/BANDAGES/DRESSINGS) ×1 IMPLANT
GLOVE BIO SURGEON STRL SZ 6.5 (GLOVE) ×4 IMPLANT
GLOVE BIO SURGEON STRL SZ7 (GLOVE) ×1 IMPLANT
GLOVE BIOGEL PI IND STRL 6.5 (GLOVE) ×1 IMPLANT
GLOVE BIOGEL PI IND STRL 7.0 (GLOVE) ×2 IMPLANT
GLOVE BIOGEL PI INDICATOR 6.5 (GLOVE) ×1
GLOVE BIOGEL PI INDICATOR 7.0 (GLOVE) ×2
GOWN STRL REUS W/TWL LRG LVL3 (GOWN DISPOSABLE) ×4 IMPLANT
KIT TURNOVER KIT A (KITS) ×2 IMPLANT
MANIFOLD NEPTUNE II (INSTRUMENTS) ×2 IMPLANT
NDL HYPO 25X1 1.5 SAFETY (NEEDLE) ×1 IMPLANT
NEEDLE HYPO 25X1 1.5 SAFETY (NEEDLE) ×2 IMPLANT
NS IRRIG 1000ML POUR BTL (IV SOLUTION) ×2 IMPLANT
PACK MINOR (CUSTOM PROCEDURE TRAY) ×1 IMPLANT
PAD ARMBOARD 7.5X6 YLW CONV (MISCELLANEOUS) ×2 IMPLANT
SET BASIN LINEN APH (SET/KITS/TRAYS/PACK) ×2 IMPLANT
SUT MNCRL AB 4-0 PS2 18 (SUTURE) ×1 IMPLANT
SUT VIC AB 3-0 SH 27 (SUTURE) ×2
SUT VIC AB 3-0 SH 27X BRD (SUTURE) IMPLANT
SWAB COLLECTION DEVICE MRSA (MISCELLANEOUS) ×1 IMPLANT
SWAB CULTURE ESWAB REG 1ML (MISCELLANEOUS) ×1 IMPLANT
SYR BULB IRRIGATION 50ML (SYRINGE) ×2 IMPLANT
SYR CONTROL 10ML LL (SYRINGE) ×2 IMPLANT
TAPE PAPER 2X10 WHT MICROPORE (GAUZE/BANDAGES/DRESSINGS) ×1 IMPLANT

## 2019-01-25 NOTE — Anesthesia Preprocedure Evaluation (Signed)
Anesthesia Evaluation  Patient identified by MRN, date of birth, ID band Patient awake    Reviewed: Allergy & Precautions, NPO status , Patient's Chart, lab work & pertinent test results  History of Anesthesia Complications (+) PONV and history of anesthetic complications  Airway Mallampati: I  TM Distance: >3 FB Neck ROM: Full    Dental no notable dental hx. (+) Teeth Intact   Pulmonary neg pulmonary ROS,    Pulmonary exam normal breath sounds clear to auscultation       Cardiovascular Exercise Tolerance: Good hypertension, Pt. on medications negative cardio ROS Normal cardiovascular examI Rhythm:Regular Rate:Normal     Neuro/Psych  Headaches, Anxiety negative psych ROS   GI/Hepatic negative GI ROS, Neg liver ROS,   Endo/Other  negative endocrine ROS  Renal/GU negative Renal ROS  negative genitourinary   Musculoskeletal negative musculoskeletal ROS (+)   Abdominal   Peds negative pediatric ROS (+)  Hematology negative hematology ROS (+)   Anesthesia Other Findings   Reproductive/Obstetrics negative OB ROS                             Anesthesia Physical Anesthesia Plan  ASA: II  Anesthesia Plan: General   Post-op Pain Management:    Induction: Intravenous  PONV Risk Score and Plan: Ondansetron, Midazolam, Dexamethasone and Treatment may vary due to age or medical condition  Airway Management Planned: LMA  Additional Equipment:   Intra-op Plan:   Post-operative Plan:   Informed Consent: I have reviewed the patients History and Physical, chart, labs and discussed the procedure including the risks, benefits and alternatives for the proposed anesthesia with the patient or authorized representative who has indicated his/her understanding and acceptance.     Dental advisory given  Plan Discussed with: CRNA  Anesthesia Plan Comments: (Plan Full PPE use  Plan GA (LMA)  with GETA as needed d/w pt -WTP with same after Q&A)        Anesthesia Quick Evaluation

## 2019-01-25 NOTE — Anesthesia Postprocedure Evaluation (Signed)
Anesthesia Post Note  Patient: Elaine Hernandez  Procedure(s) Performed: CYST REMOVAL (Left Axilla)  Patient location during evaluation: PACU Anesthesia Type: General Level of consciousness: awake and alert and oriented Pain management: pain level controlled Vital Signs Assessment: post-procedure vital signs reviewed and stable Respiratory status: spontaneous breathing Cardiovascular status: blood pressure returned to baseline and stable Postop Assessment: no apparent nausea or vomiting Anesthetic complications: no     Last Vitals:  Vitals:   01/25/19 1221 01/25/19 1253  BP: (!) 135/93 (!) 143/96  Pulse: 99 82  Resp: 18 18  Temp: 37 C 37 C  SpO2: 100% 99%    Last Pain:  Vitals:   01/25/19 1253  TempSrc: Oral  PainSc: 7                  Jarman Litton

## 2019-01-25 NOTE — Interval H&P Note (Signed)
History and Physical Interval Note:  01/25/2019 11:20 AM  Elaine Hernandez  has presented today for surgery, with the diagnosis of SEBACIOUS CYST LEFT ARMPIT.  The various methods of treatment have been discussed with the patient and family. After consideration of risks, benefits and other options for treatment, the patient has consented to  Procedure(s): CYST REMOVAL (Left) as a surgical intervention.  The patient's history has been reviewed, patient examined, no change in status, stable for surgery.  I have reviewed the patient's chart and labs.  Questions were answered to the patient's satisfaction.    No changes.  Marked.  Lucretia Roers

## 2019-01-25 NOTE — Anesthesia Procedure Notes (Addendum)
Procedure Name: LMA Insertion Date/Time: 01/25/2019 11:30 AM Performed by: Moshe Salisbury, CRNA Pre-anesthesia Checklist: Patient identified, Patient being monitored, Emergency Drugs available, Timeout performed and Suction available Patient Re-evaluated:Patient Re-evaluated prior to induction Oxygen Delivery Method: Circle System Utilized Preoxygenation: Pre-oxygenation with 100% oxygen Induction Type: IV induction Ventilation: Mask ventilation without difficulty LMA: LMA inserted LMA Size: 3.0 Number of attempts: 1 Placement Confirmation: positive ETCO2 and breath sounds checked- equal and bilateral

## 2019-01-25 NOTE — Transfer of Care (Signed)
Immediate Anesthesia Transfer of Care Note  Patient: Elaine Hernandez  Procedure(s) Performed: CYST REMOVAL (Left Axilla)  Patient Location: PACU  Anesthesia Type:General  Level of Consciousness: awake  Airway & Oxygen Therapy: Patient Spontanous Breathing  Post-op Assessment: Report given to RN  Post vital signs: Reviewed  Last Vitals:  Vitals Value Taken Time  BP 135/93 01/25/19 1222  Temp    Pulse 98 01/25/19 1226  Resp 14 01/25/19 1226  SpO2 100 % 01/25/19 1226  Vitals shown include unvalidated device data.  Last Pain:  Vitals:   01/25/19 1101  TempSrc: Oral  PainSc: 3       Patients Stated Pain Goal: 8 (01/25/19 1101)  Complications: No apparent anesthesia complications

## 2019-01-25 NOTE — Op Note (Signed)
[facility] Operative Note  01/25/19  Preoperative Diagnosis:  Left axilla sebaceous cyst    Postoperative Diagnosis: Left axilla infected sebaceous cyst    Procedure(s) Performed: Excision of cyst (3cm)   Surgeon: Leatrice Jewels. Henreitta Leber, MD   Assistants: No qualified resident was available    Anesthesia: General endotracheal   Anesthesiologist: Dorena Cookey, MD    Specimens:  Partial cyst wall, cultures    Estimated Blood Loss: Minimal   Blood Replacement: None    Complications: None    Wound Class: Infected   Operative Indications: Ms. Kley is a very sweet 24 yo with a recent swelling in her left axilla that has been causing her issues. She has taken 2 rounds of antibiotics and showed no outward signs of infection but said the area remained swollen and tender.  We discussed that this appeared to be a cyst and discussed the risk of excision including recurrence, bleeding, infection, need to pack if currently infected, and lymphedema given the location in the axilla. She expressed understanding and opted to proceed.   Findings: Large 3cm cyst with purulent drainage    Procedure: The patient was taken to the operating room and placed supine. General endotracheal anesthesia was induced. Intravenous antibiotics were administered per protocol.  The left axilla was prepared and draped in the usual sterile fashion.   An incision was made over the left axilla at the site of fluctuance where the cyst was felt to be located. This was carried down to the cyst wall with electrocautery. A small opening in the cyst was made and purulent fluid was noted. Cultures were obtained. The fluid was evacuated. The cyst was excised with electrocautery trying to include as much of the wall as possible. The posterior aspect of the wall was deep in the axilla, and due to concerns for injury to nerves and vessels I cauterized the posterior cyst wall instead of excising it.  The cavity  was cleaned and made hemostatic. Given the purulence, the area was packed with Kerlix and covered with gauze and paper tape.   Final inspection revealed acceptable hemostasis. All counts were correct at the end of the case. The patient was awakened from anesthesia and extubated without complication.  The patient went to the PACU in stable condition.   Algis Greenhouse, MD [facility] [address] [place], [place] [address] [phone] (office)

## 2019-01-25 NOTE — Discharge Instructions (Signed)
Discharge Instructions:  Common Complaints: Pain at the incision site. Bleeding and drainage at the site.   Diet/ Activity: Diet as tolerated.  Shower per your regular routine daily starting tomorrow 01/26/2019.  Do not submerge in a bath. Leave the packing in place while you shower and then remove and and place new packing after showers.  Pack the area twice daily with saline dampened gauze. You will be able to use the kerlix roll multiple times for your packing (cut enough off the roll about 12 inches initially and dampen with saline, squeeze the gauze out well). As you heal you will need less gauze to pack into the wound. Cover the area with additional gauze or pads (can obtain maxi pads from the pharmacy) and paper tape.  Rest and listen to your body, but do not remain in bed all day.  Walk everyday for at least 15-20 minutes. Deep cough and move around every 1-2 hours in the first few days after surgery.  Do not lift > 10 lbs, perform excessive bending, pushing, pulling, squatting for 1-2 weeks after surgery.   Pain Expectations and Narcotics: -After surgery you will have pain associated with your incisions and this is normal. The pain is muscular and nerve pain, and will get better with time. -You are encouraged and expected to take non narcotic medications like tylenol and ibuprofen (when able) to treat pain as multiple modalities can aid with pain treatment. -Narcotics are only used when pain is severe or there is breakthrough pain. -You are not expected to have a pain score of 0 after surgery, as we cannot prevent pain. A pain score of 3-4 that allows you to be functional, move, walk, and tolerate some activity is the goal. The pain will continue to improve over the days after surgery and is dependent on your surgery. -Due to [place] law, we are only able to give a certain amount of pain medication to treat post operative pain, and we only give additional narcotics on a patient by patient  basis.  -For most laparoscopic surgery, studies have shown that the majority of patients only need 10-15 narcotic pills, and for open surgeries most patients only need 15-20.   -Having appropriate expectations of pain and knowledge of pain management with non narcotics is important as we do not want anyone to become addicted to narcotic pain medication.  -Using ice packs in the first 48 hours and heating pads after 48 hours, wearing an abdominal binder (when recommended), and using over the counter medications are all ways to help with pain management.   -Simple acts like meditation and mindfulness practices after surgery can also help with pain control and research has proven the benefit of these practices.  Medication: Take tylenol and ibuprofen as needed for pain control, alternating every 4-6 hours.  Example:  Tylenol 1000mg  @ 6am, 12noon, 6pm, (Do not exceed 4000mg  of tylenol a day). Ibuprofen 800mg  @ 9am, 3pm, 9pm, 3am (Do not exceed 3600mg  of ibuprofen a day).  Take Roxicodone for breakthrough pain every 4 hours.  Take Colace for constipation related to narcotic pain medication. If you do not have a bowel movement in 2 days, take Miralax over the counter.  Drink plenty of water to also prevent constipation.   Contact Information: If you have questions or concerns, please call our office, [phone], Monday- Thursday 8AM-5PM and Friday 8AM-12 Noon.   If it is after hours or on the weekend, please call [facility] Main Number, [phone], and ask  to speak to the surgeon on call for Dr. Henreitta Leber at [facility].    Epidermal Cyst Removal, Care After This sheet gives you information about how to care for yourself after your procedure. Your health care provider may also give you more specific instructions. If you have problems or questions, contact your health care provider. What can I expect after the procedure? After the procedure, it is common to have:  Soreness in the area where  your cyst was removed.  Tightness or itchiness from the stitches (sutures) in your skin. Follow these instructions at home: Medicines  Take over-the-counter and prescription medicines only as told by your health care provider.  If you were prescribed an antibiotic medicine or ointment, take or apply it as told by your health care provider. Do not stop using the antibiotic even if you start to feel better. Incision care   Follow instructions from your health care provider about how to take care of your incision. Make sure you: ? Wash your hands with soap and water before you change your bandage (dressing). If soap and water are not available, use hand sanitizer. ? Change your dressing as told by your health care provider. General instructions  Do not take baths, swim, or use a hot tub until your health care provider approves.  You may shower.  Your health care provider may ask you to avoid contact sports or activities that take a lot of effort. Do not do anything that stretches or puts pressure on your incision.  You can return to your normal diet.  Keep all follow-up visits as told by your health care provider. This is important. Contact a health care provider if:  You have a fever.  You have redness, swelling, or pain in the incision area.  You have fluid or blood coming from your incision.  You have pus or a bad smell coming from your incision.  Your incision feels warm to the touch.  Your cyst grows back. Summary  After the procedure, it is common to have soreness in the area where your cyst was removed.  Take or apply over-the-counter and prescription medicines only as told by your health care provider.  Follow instructions from your health care provider about how to take care of your incision. This information is not intended to replace advice given to you by your health care provider. Make sure you discuss any questions you have with your health care  provider. Document Revised: 04/18/2017 Document Reviewed: 10/20/2016 Elsevier Patient Education  2020 Elsevier Inc.     General Anesthesia, Adult, Care After This sheet gives you information about how to care for yourself after your procedure. Your health care provider may also give you more specific instructions. If you have problems or questions, contact your health care provider. What can I expect after the procedure? After the procedure, the following side effects are common:  Pain or discomfort at the IV site.  Nausea.  Vomiting.  Sore throat.  Trouble concentrating.  Feeling cold or chills.  Weak or tired.  Sleepiness and fatigue.  Soreness and body aches. These side effects can affect parts of the body that were not involved in surgery. Follow these instructions at home:  For at least 24 hours after the procedure:  Have a responsible adult stay with you. It is important to have someone help care for you until you are awake and alert.  Rest as needed.  Do not: ? Participate in activities in which you could fall  or become injured. ? Drive. ? Use heavy machinery. ? Drink alcohol. ? Take sleeping pills or medicines that cause drowsiness. ? Make important decisions or sign legal documents. ? Take care of children on your own. Eating and drinking  Follow any instructions from your health care provider about eating or drinking restrictions.  When you feel hungry, start by eating small amounts of foods that are soft and easy to digest (bland), such as toast. Gradually return to your regular diet.  Drink enough fluid to keep your urine pale yellow.  If you vomit, rehydrate by drinking water, juice, or clear broth. General instructions  If you have sleep apnea, surgery and certain medicines can increase your risk for breathing problems. Follow instructions from your health care provider about wearing your sleep device: ? Anytime you are sleeping, including  during daytime naps. ? While taking prescription pain medicines, sleeping medicines, or medicines that make you drowsy.  Return to your normal activities as told by your health care provider. Ask your health care provider what activities are safe for you.  Take over-the-counter and prescription medicines only as told by your health care provider.  If you smoke, do not smoke without supervision.  Keep all follow-up visits as told by your health care provider. This is important. Contact a health care provider if:  You have nausea or vomiting that does not get better with medicine.  You cannot eat or drink without vomiting.  You have pain that does not get better with medicine.  You are unable to pass urine.  You develop a skin rash.  You have a fever.  You have redness around your IV site that gets worse. Get help right away if:  You have difficulty breathing.  You have chest pain.  You have blood in your urine or stool, or you vomit blood. Summary  After the procedure, it is common to have a sore throat or nausea. It is also common to feel tired.  Have a responsible adult stay with you for the first 24 hours after general anesthesia. It is important to have someone help care for you until you are awake and alert.  When you feel hungry, start by eating small amounts of foods that are soft and easy to digest (bland), such as toast. Gradually return to your regular diet.  Drink enough fluid to keep your urine pale yellow.  Return to your normal activities as told by your health care provider. Ask your health care provider what activities are safe for you. This information is not intended to replace advice given to you by your health care provider. Make sure you discuss any questions you have with your health care provider. Document Revised: 12/30/2016 Document Reviewed: 08/12/2016 Elsevier Patient Education  [place].

## 2019-01-28 LAB — SURGICAL PATHOLOGY

## 2019-01-29 ENCOUNTER — Encounter: Payer: Self-pay | Admitting: General Surgery

## 2019-01-29 ENCOUNTER — Other Ambulatory Visit: Payer: Self-pay

## 2019-01-29 ENCOUNTER — Ambulatory Visit (INDEPENDENT_AMBULATORY_CARE_PROVIDER_SITE_OTHER): Payer: Self-pay | Admitting: General Surgery

## 2019-01-29 VITALS — BP 144/94 | HR 80 | Temp 98.4°F | Resp 16 | Ht 64.5 in | Wt 186.0 lb

## 2019-01-29 DIAGNOSIS — L723 Sebaceous cyst: Secondary | ICD-10-CM

## 2019-01-29 NOTE — Progress Notes (Signed)
[facility] Note   HPI:  24 y.o. Female presents to clinic for post-op follow-up evaluation of her left axilla wound. Patient reports doing well and packing the area. She has had some minor burning and pain. Overall doing well.  Review of Systems:  No fever or chills Pain controlled  All other review of systems: otherwise negative   Pathology: FINAL MICROSCOPIC DIAGNOSIS:   A. CYST, AXILLA, LEFT, EXCISION:  - Soft tissue with abscess and fat necrosis  - No malignancy identified   Vital Signs:  BP (!) 144/94 (BP Location: Left Arm, Patient Position: Sitting, Cuff Size: Normal)   Pulse 80   Temp 98.4 F (36.9 C) (Oral)   Resp 16   Ht 5' 4.5" (1.638 m)   Wt 186 lb (84.4 kg)   LMP 01/25/2019   SpO2 96%   BMI 31.43 kg/m    Physical Exam:  Physical Exam Vitals reviewed.  HENT:     Head: Normocephalic.  Cardiovascular:     Rate and Rhythm: Normal rate.  Pulmonary:     Effort: Pulmonary effort is normal.  Chest:     Comments: Left axilla incision c/d/i with packing, minimal drainage on packing, no erythema, replaced  Neurological:     Mental Status: She is alert.    Labs:  Cultures with pan sensitive staph aureus   Assessment:  24 y.o. yo Female with an infected cyst versus abscess/ hidradenitis. She has been doing well. Pathology without definitive cyst but reports of lump there in the past. Discussed potential for this being hidradenitis versus just a cyst that was disrupted with the surgery. Overall doing well.  Plan:  Continue packing once to twice daily with saline dampened gauze. Ok to return to work.  No heavy lifting with the left arm.   All of the above recommendations were discussed with the patient, and all of patient's questions were answered to her expressed satisfaction.  Algis Greenhouse, MD [facility] [address] [place] [place], [place] [address] [phone] (office)

## 2019-01-29 NOTE — Patient Instructions (Addendum)
Continue packing once to twice daily with saline dampened gauze. Ok to return to work.  No heavy lifting with the left arm.

## 2019-01-30 LAB — AEROBIC/ANAEROBIC CULTURE W GRAM STAIN (SURGICAL/DEEP WOUND)

## 2019-01-31 ENCOUNTER — Ambulatory Visit: Payer: Self-pay | Admitting: General Surgery

## 2019-02-04 ENCOUNTER — Other Ambulatory Visit: Payer: Self-pay

## 2019-02-12 ENCOUNTER — Encounter: Payer: Self-pay | Admitting: General Surgery

## 2019-02-12 ENCOUNTER — Other Ambulatory Visit: Payer: Self-pay

## 2019-02-12 ENCOUNTER — Ambulatory Visit (INDEPENDENT_AMBULATORY_CARE_PROVIDER_SITE_OTHER): Payer: Self-pay | Admitting: General Surgery

## 2019-02-12 VITALS — BP 158/92 | HR 89 | Temp 98.6°F | Resp 16 | Ht 64.5 in | Wt 188.0 lb

## 2019-02-12 DIAGNOSIS — L723 Sebaceous cyst: Secondary | ICD-10-CM

## 2019-02-13 NOTE — Progress Notes (Signed)
[facility] Note   HPI:  24 y.o. Female presents to clinic for post-op follow-up evaluation of her left axilla wound she has been packing. She is doing it once a day now and it is healing up. She did have some bloody drainage yesterday that worried her a little. This has stopped.   Review of Systems:  No fever or chills Less tender in axilla Some numbness and soreness under the arm near axilla All other review of systems: otherwise negative   Vital Signs:  BP (!) 158/92 (BP Location: Left Arm, Patient Position: Sitting, Cuff Size: Normal)   Pulse 89   Temp 98.6 F (37 C) (Oral)   Resp 16   Ht 5' 4.5" (1.638 m)   Wt 188 lb (85.3 kg)   LMP 01/25/2019   SpO2 92%   BMI 31.77 kg/m    Physical Exam:  Physical Exam Vitals reviewed.  HENT:     Head: Normocephalic.  Cardiovascular:     Rate and Rhythm: Normal rate.  Pulmonary:     Effort: Pulmonary effort is normal.  Lymphadenopathy:     Comments: Left axilla wound healing, 2cm wide X 0.5 cm deep, very small, some rolling edges of the epithelium, silver nitrate applied to help close better, no drainage, minimal bleeding note, minor tenderness in the axilla and on the upper arm adjacent, patient reports some numbness   Neurological:     Mental Status: She is alert.     Assessment:  24 y.o. yo Female s/p excision tissue/ cyst versus hidradenitis given family history. Doing well with packing. Feeling better but does have some soreness and numbness under the arm. She has not been using the arm and does admit she has been limiting her ability to stretch etc. Discussed that this will all likely improve with more stretching and movement and encouraged this in the next few weeks.   Plan:  - Continue dry dressing over area and can transition to bandaid -Stretch and move the arm   Future Appointments  Date Time Provider Department Center  03/05/2019  9:00 AM Lucretia Roers, MD [facility] None     All of the above  recommendations were discussed with the patient, and all of patient's questions were answered to her expressed satisfaction.  Algis Greenhouse, MD [facility] [address] [place] [place], [place] [address] [phone] (office)

## 2019-03-05 ENCOUNTER — Encounter: Payer: Self-pay | Admitting: General Surgery

## 2019-03-05 ENCOUNTER — Other Ambulatory Visit: Payer: Self-pay

## 2019-03-05 ENCOUNTER — Ambulatory Visit (INDEPENDENT_AMBULATORY_CARE_PROVIDER_SITE_OTHER): Payer: Self-pay | Admitting: General Surgery

## 2019-03-05 VITALS — BP 139/100 | HR 86 | Temp 98.2°F | Resp 14 | Ht 64.5 in | Wt 193.0 lb

## 2019-03-05 DIAGNOSIS — L723 Sebaceous cyst: Secondary | ICD-10-CM

## 2019-03-05 NOTE — Progress Notes (Signed)
[facility] Note   HPI:  24 y.o. Female presents to clinic for post-op follow-up evaluation her left axilla. Patient reports doing well and having little drainage. She is not even needing a bandage.   Review of Systems:  No fever or chills Left axilla without drainage All other review of systems: otherwise negative   Vital Signs:  BP (!) 139/100   Pulse 86   Temp 98.2 F (36.8 C) (Oral)   Resp 14   Ht 5' 4.5" (1.638 m)   Wt 193 lb (87.5 kg)   SpO2 98%   BMI 32.62 kg/m    Physical Exam:  Physical Exam Vitals reviewed.  HENT:     Head: Normocephalic.  Cardiovascular:     Rate and Rhythm: Normal rate.  Pulmonary:     Effort: Pulmonary effort is normal.  Lymphadenopathy:     Comments: Left axillary wound almost healed, < 0.5cm defect with rolled edges of skin where it has not sealed completely, silver nitrate applied, reduced by at least 50% since last application   Neurological:     Mental Status: She is alert.      Assessment:  24 y.o. yo Female s/p excision of left axillary ruptured cyst versus area of hidradenitis as there were no obvious cyst wall on the pathology. Doing well. Healing.   Plan:  -Call if skin does not close or if concerns -PRN follow up   All of the above recommendations were discussed with the patient, and all of patient's questions were answered to her expressed satisfaction.  Algis Greenhouse, MD [facility] [address] [place] [place], [place] [address] [phone] (office)

## 2019-03-25 ENCOUNTER — Telehealth: Payer: Self-pay | Admitting: *Deleted

## 2019-03-25 NOTE — Telephone Encounter (Signed)
Pt left  Message that she is experiencing some spotting after sex from time to time. It is not painful. No other symptoms. She is scheduled for an OV next month but just wanted to be sure that she didn't need to do anything before then.

## 2019-03-25 NOTE — Telephone Encounter (Signed)
Spoke with pt. Pt states she has spotting after sex from time to time. No pain and no other symptoms. Pt has an appt next month. I advised pt to keep an eye on it. If bleeding gets worse or if she starts having pain or any other symptoms, call office for a sooner appt. Otherwise, can discuss at scheduled appt if it's still happening then. Pt voiced understanding. JSY

## 2019-04-29 ENCOUNTER — Encounter: Payer: Self-pay | Admitting: Women's Health

## 2019-04-29 ENCOUNTER — Ambulatory Visit (INDEPENDENT_AMBULATORY_CARE_PROVIDER_SITE_OTHER): Payer: No Typology Code available for payment source | Admitting: Women's Health

## 2019-04-29 ENCOUNTER — Other Ambulatory Visit: Payer: Self-pay

## 2019-04-29 VITALS — BP 161/102 | HR 74 | Ht 64.0 in | Wt 189.8 lb

## 2019-04-29 DIAGNOSIS — Z113 Encounter for screening for infections with a predominantly sexual mode of transmission: Secondary | ICD-10-CM

## 2019-04-29 DIAGNOSIS — I10 Essential (primary) hypertension: Secondary | ICD-10-CM | POA: Diagnosis not present

## 2019-04-29 DIAGNOSIS — R10813 Right lower quadrant abdominal tenderness: Secondary | ICD-10-CM

## 2019-04-29 DIAGNOSIS — Z01419 Encounter for gynecological examination (general) (routine) without abnormal findings: Secondary | ICD-10-CM | POA: Diagnosis not present

## 2019-04-29 NOTE — Patient Instructions (Signed)
Start taking prenatal vitamins Let Dr. Margo Aye know about blood pressures

## 2019-04-29 NOTE — Progress Notes (Signed)
WELL-WOMAN EXAMINATION Patient name: Elaine Hernandez MRN [id number]  Date of birth: August 31, 1995 Chief Complaint:   Annual Exam (last pap 02/2018)  History of Present Illness:   Elaine Hernandez is a 24 y.o. G0P0 African American female being seen today for a routine well-woman exam.  Current complaints: regular periods, anywhere from 4-7d, used to start off spotting, recently has gone straight to normal flow on day 1, some cramping in RLQ after period. Wears pads, changes anywhere from q2-5hrs, no leaking onto clothes. Some clots. Family h/o fibroids. Skipped period in Jan.  Stopped birth control d/t HTN, is on HCTZ 12.5mg , took this am. Is OK if she gets pregnant, but not actively trying. Not on pnv.   Depression screen [facility] 2/9 04/29/2019 03/09/2018  Decreased Interest 0 0  Down, Depressed, Hopeless - 0  PHQ - 2 Score 0 0  Altered sleeping 0 -  Tired, decreased energy 0 -  Change in appetite 0 -  Feeling bad or failure about yourself  0 -  Trouble concentrating 0 -  Moving slowly or fidgety/restless 0 -  Suicidal thoughts 0 -  PHQ-9 Score 0 -  Difficult doing work/chores Not difficult at all -     PCP: Margo Aye      does not desire labs Patient's last menstrual period was 04/14/2019. The current method of family planning is none.  Last pap 03/09/18. Results were: normal. H/O abnormal pap: no Last mammogram: never. Results were: N/A. Family h/o breast cancer: no Last colonoscopy: never. Results were: N/A. Family h/o colorectal cancer: no Review of Systems:   Pertinent items are noted in HPI Denies any headaches, blurred vision, fatigue, shortness of breath, chest pain, abdominal pain, abnormal vaginal discharge/itching/odor/irritation, problems with periods, bowel movements, urination, or intercourse unless otherwise stated above. Pertinent History Reviewed:  Reviewed past medical,surgical, social and family history.  Reviewed problem list, medications and allergies. Physical  Assessment:   Vitals:   04/29/19 1343 04/29/19 1344  BP: (!) 163/98 (!) 161/102  Pulse: 81 74  Weight: 189 lb 12.8 oz (86.1 kg)   Height: 5\' 4"  (1.626 m)   Body mass index is 32.58 kg/m.        Physical Examination:   General appearance - well appearing, and in no distress  Mental status - alert, oriented to person, place, and time  Psych:  She has a normal mood and affect  Skin - warm and dry, normal color, no suspicious lesions noted  Chest - effort normal, all lung fields clear to auscultation bilaterally  Heart - normal rate and regular rhythm  Neck:  midline trachea, no thyromegaly or nodules  Breasts - breasts appear normal, no suspicious masses, no skin or nipple changes or  axillary nodes  Abdomen - soft, nondistended, no masses or organomegaly, mild RLQ tenderness  Pelvic - VULVA: normal appearing vulva with no masses, tenderness or lesions  VAGINA: normal appearing vagina with normal color and discharge, no lesions  CERVIX: normal appearing cervix without discharge or lesions, no CMT  Thin prep pap is not done   UTERUS: uterus is felt to be normal size, shape, consistency and nontender   ADNEXA: No adnexal masses or tenderness noted.  Extremities:  No swelling or varicosities noted  Chaperone: Latisha Cresenzo    No results found for this or any previous visit (from the past 24 hour(s)).  Assessment & Plan:  1) Well-Woman Exam  2) CHTN> on HCTZ 12.5mg  daily, pt to call PCP and notify  them of bp's here today  3) Not on contraception> declines, is ok if gets pregnant, to start pnv daily  4) Mild RLQ tenderness, cramping after periods, moderate flow> offered pelvic u/s, will let us know if she decides for this  5) STD screen> gc/ct  Labs/procedures today: exam  Mammogram @26yo  or sooner if problems Colonoscopy @26yo  or sooner if problems  Orders Placed This Encounter  Procedures  . GC/Chlamydia Probe Amp    Meds: No orders of the defined types were placed in  this encounter.   Follow-up: Return in about 1 year (around 04/28/2020) for Physical.  Freer, WHNP-BC 04/29/2019 2:21 PM

## 2019-05-01 LAB — GC/CHLAMYDIA PROBE AMP
Chlamydia trachomatis, NAA: NEGATIVE
Neisseria Gonorrhoeae by PCR: NEGATIVE

## 2019-05-03 ENCOUNTER — Other Ambulatory Visit: Payer: Self-pay

## 2019-05-03 ENCOUNTER — Ambulatory Visit
Admission: EM | Admit: 2019-05-03 | Discharge: 2019-05-03 | Disposition: A | Discharge status: Home / Self Care | Payer: No Typology Code available for payment source | Attending: Emergency Medicine | Admitting: Emergency Medicine

## 2019-05-03 DIAGNOSIS — M79662 Pain in left lower leg: Secondary | ICD-10-CM | POA: Diagnosis not present

## 2019-05-03 MED ORDER — IBUPROFEN 800 MG PO TABS: 800.0000 mg | ORAL_TABLET | Freq: Three times a day (TID) | ORAL | 0 refills | Status: DC

## 2019-05-03 MED ORDER — PREDNISONE 10 MG PO TABS: 20.0000 mg | ORAL_TABLET | Freq: Every day | ORAL | 0 refills | Status: DC

## 2019-05-03 MED ORDER — CYCLOBENZAPRINE HCL 5 MG PO TABS: 5.0000 mg | ORAL_TABLET | Freq: Three times a day (TID) | ORAL | 0 refills | Status: DC | PRN

## 2019-05-03 NOTE — ED Triage Notes (Signed)
Pt has c/o left calf pain after working out legs on Thursday

## 2019-05-03 NOTE — ED Provider Notes (Addendum)
[facility]    CSN: [id number] Arrival date & time: 05/03/19  1350      History   Chief Complaint Chief Complaint  Patient presents with  . Leg Pain    HPI Elaine Hernandez is a 24 y.o. female.   Who presented to the urgent care with a complaint of left calf pain for the past 1 day.  Reports she was exercising and developed the symptoms thereafter.  Localized the pain to the left calf.  Reports she is unable to put weight on her left leg.  Described the pain as constant and achy and rated at 6 on a scale of 1-10.  Has tried OTC Tylenol with mild relief.  Symptoms are made worse with range of motion.  Denies similar symptoms in the past.  Denies chills, fever, nausea, vomiting, diarrhea trauma or injury.  The history is provided by the patient. No language interpreter was used.  Leg Pain   Past Medical History:  Diagnosis Date  . Headache   . Hypertension   . Panic attacks   . PONV (postoperative nausea and vomiting)     Patient Active Problem List   Diagnosis Date Noted  . Sebaceous cyst of left axilla 01/22/2019  . Chronic hypertension 02/01/2018  . Foreign body, hand, superficial     Past Surgical History:  Procedure Laterality Date  . CYST EXCISION Left 01/25/2019   Procedure: CYST REMOVAL;  Surgeon: Virl Cagey, MD;  Location: [facility];  Service: General;  Laterality: Left;  . FOREIGN BODY REMOVAL Left 09/30/2015   Procedure: FOREIGN BODY REMOVAL ADULT;  Surgeon: Carole Civil, MD;  Location: [facility];  Service: Orthopedics;  Laterality: Left;  LEFT HAND  . WISDOM TOOTH EXTRACTION Bilateral     OB History    Gravida  0   Para  0   Term      Preterm      AB      Living        SAB      TAB      Ectopic      Multiple      Live Births               Home Medications    Prior to Admission medications   Medication Sig Start Date End Date Taking? Authorizing Provider  cyclobenzaprine (FLEXERIL) 5 MG tablet Take 1 tablet  (5 mg total) by mouth 3 (three) times daily as needed for muscle spasms. 05/03/19   Tryton Bodi, Darrelyn Hillock, FNP  hydrochlorothiazide (MICROZIDE) 12.5 MG capsule Take 12.5 mg by mouth daily.    [provider]  ibuprofen (ADVIL) 800 MG tablet Take 1 tablet (800 mg total) by mouth 3 (three) times daily. 05/03/19   Jaramie Bastos, Darrelyn Hillock, FNP  Multiple Vitamin (MULTIVITAMIN WITH MINERALS) TABS tablet Take 2 tablets by mouth daily. Women's One-A-Day    [provider]  predniSONE (DELTASONE) 10 MG tablet Take 2 tablets (20 mg total) by mouth daily. 05/03/19   Zebedee Segundo, Darrelyn Hillock, FNP  amLODipine (NORVASC) 5 MG tablet Take 1 tablet (5 mg total) by mouth daily. 03/09/18 01/05/19  Estill Dooms, NP  norethindrone (MICRONOR,CAMILA,ERRIN) 0.35 MG tablet Take 1 tablet (0.35 mg total) by mouth daily. 03/09/18 01/05/19  Estill Dooms, NP    Family History Family History  Problem Relation Age of Onset  . Hypertension Mother   . Hypertension Father   . Cancer Paternal Grandfather  lung  . Cancer Paternal Grandmother        lung  . Hypertension Maternal Grandmother   . Alcohol abuse Maternal Grandfather     Social History Social History   Tobacco Use  . Smoking status: Never Smoker  . Smokeless tobacco: Never Used  Substance Use Topics  . Alcohol use: Yes    Comment: occasional  . Drug use: No     Allergies   Amoxicillin, Cefdinir, Other, and Smz-tmp ds [sulfamethoxazole-trimethoprim]   Review of Systems Review of Systems  Constitutional: Negative.   Respiratory: Negative.   Cardiovascular: Negative.   Musculoskeletal:       Left calf pain  All other systems reviewed and are negative.    Physical Exam Triage Vital Signs ED Triage Vitals [05/03/19 1400]  Enc Vitals Group     BP (!) 166/94     Pulse Rate 95     Resp 18     Temp 98.6 F (37 C)     Temp src      SpO2 97 %     Weight      Height      Head Circumference      Peak Flow      Pain Score       Pain Loc      Pain Edu?      Excl. in GC?    No data found.  Updated Vital Signs BP (!) 166/94   Pulse 95   Temp 98.6 F (37 C)   Resp 18   LMP 04/14/2019   SpO2 97%   Visual Acuity Right Eye Distance:   Left Eye Distance:   Bilateral Distance:    Right Eye Near:   Left Eye Near:    Bilateral Near:     Physical Exam Vitals and nursing note reviewed.  Constitutional:      General: She is not in acute distress.    Appearance: Normal appearance. She is normal weight. She is not ill-appearing, toxic-appearing or diaphoretic.  Cardiovascular:     Rate and Rhythm: Normal rate and regular rhythm.     Pulses: Normal pulses.     Heart sounds: Normal heart sounds. No murmur. No gallop.   Pulmonary:     Effort: Pulmonary effort is normal. No respiratory distress.     Breath sounds: Normal breath sounds. No stridor. No wheezing, rhonchi or rales.  Chest:     Chest wall: No tenderness.  Musculoskeletal:        General: Tenderness present. No swelling.     Right lower leg: Normal. No edema.     Left lower leg: Tenderness present. No edema.     Comments: Tremors to calf.  No warmth, erythema, dislocation or blanching of skin, gangrene, poor pulse, or sign of compartment syndrome.  Pressure weightbearing on the left leg with pain in the left calf.  Left calf measurement 16.1 inches.  Right calf measurement 16.3  Neurological:     Mental Status: She is alert.      UC Treatments / Results  Labs (all labs ordered are listed, but only abnormal results are displayed) Labs Reviewed - No data to display  EKG   Radiology No results found.  Procedures Procedures (including critical care time)  Medications Ordered in UC Medications - No data to display  Initial Impression / Assessment and Plan / UC Course  I have reviewed the triage vital signs and the nursing notes.  Pertinent labs & imaging results  that were available during my care of the patient were reviewed by me  and considered in my medical decision making (see chart for details).    Patient is stable at discharge.  Will prescribe ibuprofen, Flexeril and prednisone for  pain.  Was advised to follow-up with PCP if symptoms does not resolve for further evaluation with ultrasound to rule out DVT.  Final Clinical Impressions(s) / UC Diagnoses   Final diagnoses:  Pain of left calf     Discharge Instructions     Ibuprofen was prescribed for pain Flexeril was prescribed Prednisone was prescribed/take medication as directed Follow-up with PCP if symptoms does not resolve for further evaluation with ultrasound to rule out DVT Return or go to ED for worsening of symptoms    ED Prescriptions    Medication Sig Dispense Auth. Provider   ibuprofen (ADVIL) 800 MG tablet Take 1 tablet (800 mg total) by mouth 3 (three) times daily. 21 tablet Shalayne Leach, Zachery Dakins, FNP   cyclobenzaprine (FLEXERIL) 5 MG tablet Take 1 tablet (5 mg total) by mouth 3 (three) times daily as needed for muscle spasms. 30 tablet Jasmond River S, FNP   predniSONE (DELTASONE) 10 MG tablet Take 2 tablets (20 mg total) by mouth daily. 15 tablet Blong Busk, Zachery Dakins, FNP     PDMP not reviewed this encounter.      Durward Parcel, FNP 05/03/19 1428

## 2019-05-03 NOTE — Discharge Instructions (Addendum)
Ibuprofen was prescribed for pain Flexeril was prescribed Prednisone was prescribed/take medication as directed Follow-up with PCP if symptoms does not resolve for further evaluation with ultrasound to rule out DVT Return or go to ED for worsening of symptoms

## 2019-06-05 NOTE — Telephone Encounter (Signed)
Error

## 2019-06-09 ENCOUNTER — Other Ambulatory Visit: Payer: Self-pay

## 2019-06-09 ENCOUNTER — Encounter: Payer: Self-pay | Admitting: Emergency Medicine

## 2019-06-09 ENCOUNTER — Ambulatory Visit
Admission: EM | Admit: 2019-06-09 | Discharge: 2019-06-09 | Disposition: A | Discharge status: Home / Self Care | Payer: No Typology Code available for payment source | Attending: Emergency Medicine | Admitting: Emergency Medicine

## 2019-06-09 DIAGNOSIS — S41102A Unspecified open wound of left upper arm, initial encounter: Secondary | ICD-10-CM | POA: Diagnosis not present

## 2019-06-09 MED ORDER — FLUCONAZOLE 200 MG PO TABS: ORAL_TABLET | ORAL | 0 refills | Status: DC

## 2019-06-09 MED ORDER — DOXYCYCLINE HYCLATE 100 MG PO CAPS: 100.0000 mg | ORAL_CAPSULE | Freq: Two times a day (BID) | ORAL | 0 refills | Status: DC

## 2019-06-09 NOTE — ED Triage Notes (Signed)
Pt had an abscess surgically drained in January , wound was left open for a while to drain.  Wound was closed using silver nitrate sticks mid April.  Pt noticed last night there is an open area to the wound.  No drainage, but does report mild tenderness.

## 2019-06-09 NOTE — Discharge Instructions (Signed)
Wash site daily with warm water and mild soap Keep covered to avoid friction Take antibiotic as prescribed and to completion Ibuprofen and tylenol as needed for pain Follow up here or with surgeon later this week for recheck Return or go to the ED if you have any new or worsening symptoms increased redness, swelling, pain, nausea, vomiting, fever, chills, etc..Marland Kitchen

## 2019-06-09 NOTE — ED Provider Notes (Signed)
[facility]   [id number] 06/09/19 Arrival Time: 1117   CC: Wound  SUBJECTIVE:  Elaine Hernandez is a 24 y.o. female who presents with a small open incision to LT underarm x 1 day. Had cyst removed earlier this year.  Was closed recently with silver nitrate at OB, but incision opened up again last night after patient lifted arms overhead.  Complains of mild discomfort.  Denies fever, chills, nausea, vomiting, redness, swelling.   ROS: As per HPI.  All other pertinent ROS negative.     Past Medical History:  Diagnosis Date  . Headache   . Hypertension   . Panic attacks   . PONV (postoperative nausea and vomiting)    Past Surgical History:  Procedure Laterality Date  . CYST EXCISION Left 01/25/2019   Procedure: CYST REMOVAL;  Surgeon: Lucretia Roers, MD;  Location: [facility];  Service: General;  Laterality: Left;  . FOREIGN BODY REMOVAL Left 09/30/2015   Procedure: FOREIGN BODY REMOVAL ADULT;  Surgeon: Vickki Hearing, MD;  Location: [facility];  Service: Orthopedics;  Laterality: Left;  LEFT HAND  . WISDOM TOOTH EXTRACTION Bilateral    Allergies  Allergen Reactions  . Amoxicillin Hives    Did it involve swelling of the face/tongue/throat, SOB, or low BP? No Did it involve sudden or severe rash/hives, skin peeling, or any reaction on the inside of your mouth or nose? No Did you need to seek medical attention at a hospital or doctor's office? Yes When did it last happen?2011  If all above answers are "NO", may proceed with cephalosporin use.   . Cefdinir Hives    Possible Patient is unsure  . Other Other (See Comments)    Citrus fruits. Tongue and throat burning.  . Smz-Tmp Ds [Sulfamethoxazole-Trimethoprim] Hives   No current facility-administered medications on file prior to encounter.   Current Outpatient Medications on File Prior to Encounter  Medication Sig Dispense Refill  . hydrochlorothiazide (MICROZIDE) 12.5 MG capsule Take 12.5 mg by mouth daily.     Marland Kitchen ibuprofen (ADVIL) 800 MG tablet Take 1 tablet (800 mg total) by mouth 3 (three) times daily. 21 tablet 0  . Multiple Vitamin (MULTIVITAMIN WITH MINERALS) TABS tablet Take 2 tablets by mouth daily. Women's One-A-Day    . [DISCONTINUED] amLODipine (NORVASC) 5 MG tablet Take 1 tablet (5 mg total) by mouth daily. 30 tablet 6  . [DISCONTINUED] norethindrone (MICRONOR,CAMILA,ERRIN) 0.35 MG tablet Take 1 tablet (0.35 mg total) by mouth daily. 1 Package 11   Social History   Socioeconomic History  . Marital status: Single    Spouse name: Not on file  . Number of children: Not on file  . Years of education: Not on file  . Highest education level: Not on file  Occupational History  . Not on file  Tobacco Use  . Smoking status: Never Smoker  . Smokeless tobacco: Never Used  Substance and Sexual Activity  . Alcohol use: Yes    Comment: occasional  . Drug use: No  . Sexual activity: Yes    Birth control/protection: Condom, None  Other Topics Concern  . Not on file  Social History Narrative  . Not on file   Social Determinants of Health   Financial Resource Strain:   . Difficulty of Paying Living Expenses:   Food Insecurity: No Food Insecurity  . Worried About Programme researcher, broadcasting/film/video in the Last Year: Never true  . Ran Out of Food in the Last Year: Never true  Transportation Needs:   . Film/video editor (Medical):   Marland Kitchen Lack of Transportation (Non-Medical):   Physical Activity:   . Days of Exercise per Week:   . Minutes of Exercise per Session:   Stress:   . Feeling of Stress :   Social Connections:   . Frequency of Communication with Friends and Family:   . Frequency of Social Gatherings with Friends and Family:   . Attends Religious Services:   . Active Member of Clubs or Organizations:   . Attends Archivist Meetings:   Marland Kitchen Marital Status:   Intimate Partner Violence: Not At Risk  . Fear of Current or Ex-Partner: No  . Emotionally Abused: No  . Physically Abused:  No  . Sexually Abused: No   Family History  Problem Relation Age of Onset  . Hypertension Mother   . Hypertension Father   . Cancer Paternal Grandfather        lung  . Cancer Paternal Grandmother        lung  . Hypertension Maternal Grandmother   . Alcohol abuse Maternal Grandfather     OBJECTIVE:  Vitals:   06/09/19 1126 06/09/19 1127  BP: 137/90   Pulse: 76   Resp: 18   Temp: 98.8 F (37.1 C)   TempSrc: Oral   SpO2: 98%   Weight: 180 lb (81.6 kg)   Height: 5\' 1"  (1.549 m) 5\' 4"  (1.626 m)     General appearance: alert; no distress Skin: 0.5-1 cm open wound of her LT axilla; mildly tender to touch; no active drainage Psychological: alert and cooperative; normal mood and affect  ASSESSMENT & PLAN:  1. Wound, open axilla, left, initial encounter     Meds ordered this encounter  Medications  . doxycycline (VIBRAMYCIN) 100 MG capsule    Sig: Take 1 capsule (100 mg total) by mouth 2 (two) times daily.    Dispense:  20 capsule    Refill:  0    Order Specific Question:   Supervising Provider    Answer:   Raylene Everts [[id number]]  . fluconazole (DIFLUCAN) 200 MG tablet    Sig: Take one dose by mouth, wait 72 hours, and then take second dose by mouth    Dispense:  3 tablet    Refill:  0    Order Specific Question:   Supervising Provider    Answer:   Raylene Everts [[id number]]   Wash site daily with warm water and mild soap Keep covered to avoid friction Take antibiotic as prescribed and to completion Ibuprofen and tylenol as needed for pain Follow up here or with surgeon later this week for recheck Return or go to the ED if you have any new or worsening symptoms increased redness, swelling, pain, nausea, vomiting, fever, chills, etc...    Reviewed expectations re: course of current medical issues. Questions answered. Outlined signs and symptoms indicating need for more acute intervention. Patient verbalized understanding. After Visit Summary  given.          Lestine Box, PA-C 06/09/19 1148

## 2019-06-11 ENCOUNTER — Telehealth: Payer: Self-pay

## 2019-06-11 NOTE — Telephone Encounter (Signed)
Patient states she had excision of left axilla cyst January 2021- she stretched her arm and the incision popped open- she went to urgent care- placed on Antibiotic and was asked to follow up with surgeon. Due to patients work schedule she is unable to schedule an appointment with Dr.Bridges and stated she would follow up with PCP because his office open at 7 and she would not need to miss work.

## 2019-07-09 ENCOUNTER — Ambulatory Visit (INDEPENDENT_AMBULATORY_CARE_PROVIDER_SITE_OTHER): Payer: No Typology Code available for payment source

## 2019-07-09 ENCOUNTER — Other Ambulatory Visit: Payer: Self-pay

## 2019-07-09 ENCOUNTER — Ambulatory Visit
Admission: EM | Admit: 2019-07-09 | Discharge: 2019-07-09 | Disposition: A | Discharge status: Home / Self Care | Payer: No Typology Code available for payment source | Attending: Emergency Medicine | Admitting: Emergency Medicine

## 2019-07-09 ENCOUNTER — Encounter: Payer: Self-pay | Admitting: Emergency Medicine

## 2019-07-09 DIAGNOSIS — S92352A Displaced fracture of fifth metatarsal bone, left foot, initial encounter for closed fracture: Secondary | ICD-10-CM

## 2019-07-09 DIAGNOSIS — S92345A Nondisplaced fracture of fourth metatarsal bone, left foot, initial encounter for closed fracture: Secondary | ICD-10-CM | POA: Diagnosis not present

## 2019-07-09 DIAGNOSIS — M79672 Pain in left foot: Secondary | ICD-10-CM

## 2019-07-09 NOTE — Discharge Instructions (Addendum)
Continue to take OTC Tylenol/ibuprofen as needed for pain Follow RICE instruction that is attached Follow-up with PCP Return or go to ED for worsening symptoms

## 2019-07-09 NOTE — ED Triage Notes (Signed)
Tripped and fell Friday, twisted LT foot, pain has continued to get worse.

## 2019-07-09 NOTE — ED Provider Notes (Signed)
[facility]   [id number] 07/09/19 Arrival Time: 1812   Chief Complaint  Patient presents with  . Foot Injury     SUBJECTIVE: History from: patient.  Elaine Hernandez is a 24 y.o. female who presented to the urgent care for complaint of left foot pain for the past 2 days.  It started after twisting her left foot.  She localized the pain to the left foot.  She described as constant and achy, rated 8 on a scale 1-10.  She has tried OTC medications/ice without relief.  Her symptoms are made worse with ROM.  She denies similar symptoms in the past.  Denies chills, fever, nausea, vomiting, diarrhea   ROS: As per HPI.  All other pertinent ROS negative.      Past Medical History:  Diagnosis Date  . Headache   . Hypertension   . Panic attacks   . PONV (postoperative nausea and vomiting)    Past Surgical History:  Procedure Laterality Date  . CYST EXCISION Left 01/25/2019   Procedure: CYST REMOVAL;  Surgeon: Lucretia Roers, MD;  Location: [facility];  Service: General;  Laterality: Left;  . FOREIGN BODY REMOVAL Left 09/30/2015   Procedure: FOREIGN BODY REMOVAL ADULT;  Surgeon: Vickki Hearing, MD;  Location: [facility];  Service: Orthopedics;  Laterality: Left;  LEFT HAND  . WISDOM TOOTH EXTRACTION Bilateral    Allergies  Allergen Reactions  . Amoxicillin Hives    Did it involve swelling of the face/tongue/throat, SOB, or low BP? No Did it involve sudden or severe rash/hives, skin peeling, or any reaction on the inside of your mouth or nose? No Did you need to seek medical attention at a hospital or doctor's office? Yes When did it last happen?2011  If all above answers are "NO", may proceed with cephalosporin use.   . Cefdinir Hives    Possible Patient is unsure  . Other Other (See Comments)    Citrus fruits. Tongue and throat burning.  . Smz-Tmp Ds [Sulfamethoxazole-Trimethoprim] Hives   No current facility-administered medications on file prior to encounter.     Current Outpatient Medications on File Prior to Encounter  Medication Sig Dispense Refill  . hydrochlorothiazide (MICROZIDE) 12.5 MG capsule Take 12.5 mg by mouth daily.    Marland Kitchen doxycycline (VIBRAMYCIN) 100 MG capsule Take 1 capsule (100 mg total) by mouth 2 (two) times daily. 20 capsule 0  . fluconazole (DIFLUCAN) 200 MG tablet Take one dose by mouth, wait 72 hours, and then take second dose by mouth 3 tablet 0  . ibuprofen (ADVIL) 800 MG tablet Take 1 tablet (800 mg total) by mouth 3 (three) times daily. 21 tablet 0  . Multiple Vitamin (MULTIVITAMIN WITH MINERALS) TABS tablet Take 2 tablets by mouth daily. Women's One-A-Day    . [DISCONTINUED] amLODipine (NORVASC) 5 MG tablet Take 1 tablet (5 mg total) by mouth daily. 30 tablet 6  . [DISCONTINUED] norethindrone (MICRONOR,CAMILA,ERRIN) 0.35 MG tablet Take 1 tablet (0.35 mg total) by mouth daily. 1 Package 11   Social History   Socioeconomic History  . Marital status: Single    Spouse name: Not on file  . Number of children: Not on file  . Years of education: Not on file  . Highest education level: Not on file  Occupational History  . Not on file  Tobacco Use  . Smoking status: Never Smoker  . Smokeless tobacco: Never Used  Vaping Use  . Vaping Use: Never used  Substance and Sexual Activity  .  Alcohol use: Yes    Comment: occasional  . Drug use: No  . Sexual activity: Yes    Birth control/protection: Condom, None  Other Topics Concern  . Not on file  Social History Narrative  . Not on file   Social Determinants of Health   Financial Resource Strain:   . Difficulty of Paying Living Expenses:   Food Insecurity: No Food Insecurity  . Worried About Programme researcher, broadcasting/film/video in the Last Year: Never true  . Ran Out of Food in the Last Year: Never true  Transportation Needs:   . Lack of Transportation (Medical):   Marland Kitchen Lack of Transportation (Non-Medical):   Physical Activity:   . Days of Exercise per Week:   . Minutes of Exercise  per Session:   Stress:   . Feeling of Stress :   Social Connections:   . Frequency of Communication with Friends and Family:   . Frequency of Social Gatherings with Friends and Family:   . Attends Religious Services:   . Active Member of Clubs or Organizations:   . Attends Banker Meetings:   Marland Kitchen Marital Status:   Intimate Partner Violence: Not At Risk  . Fear of Current or Ex-Partner: No  . Emotionally Abused: No  . Physically Abused: No  . Sexually Abused: No   Family History  Problem Relation Age of Onset  . Hypertension Mother   . Hypertension Father   . Cancer Paternal Grandfather        lung  . Cancer Paternal Grandmother        lung  . Hypertension Maternal Grandmother   . Alcohol abuse Maternal Grandfather     OBJECTIVE:  Vitals:   07/09/19 1822 07/09/19 1823  BP: (!) 151/99   Pulse: 97   Resp: 19   Temp: 98.9 F (37.2 C)   SpO2: 99%   Weight:  178 lb 9.2 oz (81 kg)  Height:  5\' 4"  (1.626 m)     Physical Exam Vitals and nursing note reviewed.  Constitutional:      General: She is not in acute distress.    Appearance: Normal appearance. She is normal weight. She is not ill-appearing, toxic-appearing or diaphoretic.  Cardiovascular:     Rate and Rhythm: Normal rate and regular rhythm.     Pulses: Normal pulses.     Heart sounds: Normal heart sounds. No murmur heard.  No friction rub. No gallop.   Pulmonary:     Effort: Pulmonary effort is normal. No respiratory distress.     Breath sounds: Normal breath sounds. No stridor. No wheezing, rhonchi or rales.  Chest:     Chest wall: No tenderness.  Musculoskeletal:        General: Tenderness present.     Right foot: Normal.     Left foot: Tenderness present. No swelling.     Comments: Patient is able to bear weight and ambulate with pain.  No surface trauma, ecchymosis, erythema, lesion, ulcer or break in skin integrity.  The left foot is without obvious asymmetry or deformity when compared to  the right foot.  Tenderness over Achilles tendon.  Pain with range of motion.  Neurovascular status intact  Neurological:     Mental Status: She is alert.    LABS:  No results found for this or any previous visit (from the past 24 hour(s)).   ASSESSMENT & PLAN:  1. Left foot pain   2. Closed fracture of base of fifth metatarsal bone  of left foot, initial encounter   3. Closed nondisplaced fracture of fourth metatarsal bone of left foot, initial encounter     No orders of the defined types were placed in this encounter.  Patient is stable at discharge.  Left foot x-ray is positive for bony abnormality including fracture of the fifth metatarsal and nondisplaced fracture of the fourth metatarsal.  I have reviewed the x-ray myself and the radiologist interpretation.  I am in agreement with the radiologist interpretation.  Postop shoe will be applied in office.  Discharge Instructions Continue to take OTC Tylenol/ibuprofen as needed for pain Follow RICE instruction that is attached Follow-up with PCP Return or go to ED for worsening symptoms  Reviewed expectations re: course of current medical issues. Questions answered. Outlined signs and symptoms indicating need for more acute intervention. Patient verbalized understanding. After Visit Summary given.      Note: This document was prepared using Dragon voice recognition software and may include unintentional dictation errors.    Durward Parcel, FNP 07/09/19 1943

## 2019-07-11 ENCOUNTER — Encounter: Payer: Self-pay | Admitting: Orthopaedic Surgery

## 2019-07-11 ENCOUNTER — Ambulatory Visit (INDEPENDENT_AMBULATORY_CARE_PROVIDER_SITE_OTHER): Payer: No Typology Code available for payment source | Admitting: Orthopaedic Surgery

## 2019-07-11 VITALS — BP 142/93 | HR 80 | Ht 64.0 in | Wt 185.0 lb

## 2019-07-11 DIAGNOSIS — S92345A Nondisplaced fracture of fourth metatarsal bone, left foot, initial encounter for closed fracture: Secondary | ICD-10-CM | POA: Diagnosis not present

## 2019-07-11 DIAGNOSIS — S92355A Nondisplaced fracture of fifth metatarsal bone, left foot, initial encounter for closed fracture: Secondary | ICD-10-CM | POA: Diagnosis not present

## 2019-07-11 NOTE — Progress Notes (Signed)
Subjective:    Patient ID: Elaine Hernandez, female    DOB: [birth year], 24 y.o.   MRN: [id number]  HPI She took a misstep on June 25 and hurt her left heel and foot.  The pain continued.  She was seen at Urgent Care on 6-29.  X-rays showed: IMPRESSION: Suspect nondisplaced fracture distal fifth metatarsal and incomplete nondisplaced fracture distal fourth metatarsal. No other findings suggesting potential fracture. No dislocation. No appreciable arthropathy.  These results will be called to the ordering clinician or representative by the Radiologist Assistant, and communication documented in the PACS or [employer].  She was given a post op shoe.  It hurts her heel and she would like something else.  She has no other injury.  She has no redness.  I have independently reviewed and interpreted x-rays of this patient done at another site by another physician or qualified health professional.  I have reviewed the notes.   Review of Systems  Constitutional: Positive for activity change.  Musculoskeletal: Positive for arthralgias and gait problem.  All other systems reviewed and are negative.  For Review of Systems, all other systems reviewed and are negative.  The following is a summary of the past history medically, past history surgically, known current medicines, social history and family history.  This information is gathered electronically by the computer from prior information and documentation.  I review this each visit and have found including this information at this point in the chart is beneficial and informative.   Past Medical History:  Diagnosis Date  . Headache   . Hypertension   . Panic attacks   . PONV (postoperative nausea and vomiting)     Past Surgical History:  Procedure Laterality Date  . CYST EXCISION Left 01/25/2019   Procedure: CYST REMOVAL;  Surgeon: Lucretia Roers, MD;  Location: [facility];  Service: General;  Laterality: Left;  . FOREIGN BODY  REMOVAL Left 09/30/2015   Procedure: FOREIGN BODY REMOVAL ADULT;  Surgeon: Vickki Hearing, MD;  Location: [facility];  Service: Orthopedics;  Laterality: Left;  LEFT HAND  . WISDOM TOOTH EXTRACTION Bilateral     Current Outpatient Medications on File Prior to Visit  Medication Sig Dispense Refill  . hydrochlorothiazide (MICROZIDE) 12.5 MG capsule Take 12.5 mg by mouth daily. (Patient not taking: Reported on 07/11/2019)    . ibuprofen (ADVIL) 800 MG tablet Take 1 tablet (800 mg total) by mouth 3 (three) times daily. (Patient not taking: Reported on 07/11/2019) 21 tablet 0  . Multiple Vitamin (MULTIVITAMIN WITH MINERALS) TABS tablet Take 2 tablets by mouth daily. Women's One-A-Day (Patient not taking: Reported on 07/11/2019)    . [DISCONTINUED] amLODipine (NORVASC) 5 MG tablet Take 1 tablet (5 mg total) by mouth daily. 30 tablet 6  . [DISCONTINUED] norethindrone (MICRONOR,CAMILA,ERRIN) 0.35 MG tablet Take 1 tablet (0.35 mg total) by mouth daily. 1 Package 11   No current facility-administered medications on file prior to visit.    Social History   Socioeconomic History  . Marital status: Single    Spouse name: Not on file  . Number of children: Not on file  . Years of education: Not on file  . Highest education level: Not on file  Occupational History  . Not on file  Tobacco Use  . Smoking status: Never Smoker  . Smokeless tobacco: Never Used  Vaping Use  . Vaping Use: Never used  Substance and Sexual Activity  . Alcohol use: Yes    Comment: occasional  .  Drug use: No  . Sexual activity: Yes    Birth control/protection: Condom, None  Other Topics Concern  . Not on file  Social History Narrative  . Not on file   Social Determinants of Health   Financial Resource Strain:   . Difficulty of Paying Living Expenses:   Food Insecurity: No Food Insecurity  . Worried About Programme researcher, broadcasting/film/video in the Last Year: Never true  . Ran Out of Food in the Last Year: Never true  Transportation  Needs:   . Lack of Transportation (Medical):   Marland Kitchen Lack of Transportation (Non-Medical):   Physical Activity:   . Days of Exercise per Week:   . Minutes of Exercise per Session:   Stress:   . Feeling of Stress :   Social Connections:   . Frequency of Communication with Friends and Family:   . Frequency of Social Gatherings with Friends and Family:   . Attends Religious Services:   . Active Member of Clubs or Organizations:   . Attends Banker Meetings:   Marland Kitchen Marital Status:   Intimate Partner Violence: Not At Risk  . Fear of Current or Ex-Partner: No  . Emotionally Abused: No  . Physically Abused: No  . Sexually Abused: No    Family History  Problem Relation Age of Onset  . Hypertension Mother   . Hypertension Father   . Cancer Paternal Grandfather        lung  . Cancer Paternal Grandmother        lung  . Hypertension Maternal Grandmother   . Alcohol abuse Maternal Grandfather     BP (!) 142/93   Pulse 80   Ht 5\' 4"  (1.626 m)   Wt 185 lb (83.9 kg)   BMI 31.76 kg/m   Body mass index is 31.76 kg/m.     Objective:   Physical Exam Vitals and nursing note reviewed.  Constitutional:      Appearance: She is well-developed.  HENT:     Head: Normocephalic and atraumatic.  Eyes:     Conjunctiva/sclera: Conjunctivae normal.     Pupils: Pupils are equal, round, and reactive to light.  Cardiovascular:     Rate and Rhythm: Normal rate and regular rhythm.  Pulmonary:     Effort: Pulmonary effort is normal.  Abdominal:     Palpations: Abdomen is soft.  Musculoskeletal:     Cervical back: Normal range of motion and neck supple.       Feet:  Skin:    General: Skin is warm and dry.  Neurological:     Mental Status: She is alert and oriented to person, place, and time.     Cranial Nerves: No cranial nerve deficit.     Motor: No abnormal muscle tone.     Coordination: Coordination normal.     Deep Tendon Reflexes: Reflexes are normal and symmetric. Reflexes  normal.  Psychiatric:        Behavior: Behavior normal.        Thought Content: Thought content normal.        Judgment: Judgment normal.           Assessment & Plan:   Encounter Diagnoses  Name Primary?  . Closed nondisplaced fracture of fourth metatarsal bone of left foot, initial encounter Yes  . Closed nondisplaced fracture of fifth metatarsal bone of left foot, initial encounter    She was fitted and given CAM walker.  Instructions for contrast baths given.  Return in  two weeks.  X-rays of the left foot on return.  Call if any problem.  Precautions discussed.   Electronically Signed Darreld Mclean, MD 7/1/20218:12 AM

## 2019-07-25 ENCOUNTER — Other Ambulatory Visit: Payer: Self-pay

## 2019-07-25 ENCOUNTER — Ambulatory Visit (INDEPENDENT_AMBULATORY_CARE_PROVIDER_SITE_OTHER): Payer: No Typology Code available for payment source | Admitting: Orthopaedic Surgery

## 2019-07-25 ENCOUNTER — Ambulatory Visit: Payer: No Typology Code available for payment source

## 2019-07-25 ENCOUNTER — Encounter: Payer: Self-pay | Admitting: Orthopaedic Surgery

## 2019-07-25 DIAGNOSIS — S92345D Nondisplaced fracture of fourth metatarsal bone, left foot, subsequent encounter for fracture with routine healing: Secondary | ICD-10-CM

## 2019-07-25 NOTE — Progress Notes (Signed)
My heel is more tender  She has been using the CAM walker.  She has heel pain now but no foot pain in forefoot.  NV intact.  I have explained stretching exercises for her foot and heel pain.  X-rays were done, reported separately.  Encounter Diagnosis  Name Primary?  . Closed nondisplaced fracture of fourth metatarsal bone of left foot with routine healing, subsequent encounter Yes   Come out of CAM walker.  Return in three weeks.  Call if any problem.  Precautions discussed.   Electronically Signed Darreld Mclean, MD 7/15/20218:46 AM

## 2019-09-03 ENCOUNTER — Telehealth: Payer: Self-pay | Admitting: Women's Health

## 2019-09-03 NOTE — Telephone Encounter (Signed)
Pt having heavy bleeding. Has been told she had a fibroid in the past. Has not been checked lately as far as fibroid. Advised to schedule and appt with one of the drs. Pt voiced understanding. Call transferred to [facility]. JSY

## 2019-09-03 NOTE — Telephone Encounter (Signed)
Patient called stating that she got her period yesterday and it was painful and heavy. today she woke up tired and drained. Pt states that she was told that she has fibroids and she would like to know if she needs to make an appointment to come and be seen or what does she need to do. Please contact pt

## 2019-09-13 ENCOUNTER — Ambulatory Visit: Payer: No Typology Code available for payment source | Admitting: Obstetrics & Gynecology

## 2019-09-19 ENCOUNTER — Ambulatory Visit: Payer: No Typology Code available for payment source | Admitting: Obstetrics & Gynecology

## 2020-01-31 ENCOUNTER — Ambulatory Visit (INDEPENDENT_AMBULATORY_CARE_PROVIDER_SITE_OTHER): Payer: No Typology Code available for payment source

## 2020-01-31 ENCOUNTER — Ambulatory Visit
Admission: EM | Admit: 2020-01-31 | Discharge: 2020-01-31 | Disposition: A | Discharge status: Home / Self Care | Payer: No Typology Code available for payment source | Attending: Family Medicine | Admitting: Family Medicine

## 2020-01-31 ENCOUNTER — Other Ambulatory Visit: Payer: Self-pay

## 2020-01-31 ENCOUNTER — Encounter: Payer: Self-pay | Admitting: Emergency Medicine

## 2020-01-31 DIAGNOSIS — K5900 Constipation, unspecified: Secondary | ICD-10-CM | POA: Insufficient documentation

## 2020-01-31 DIAGNOSIS — R1915 Other abnormal bowel sounds: Secondary | ICD-10-CM | POA: Diagnosis not present

## 2020-01-31 DIAGNOSIS — R1031 Right lower quadrant pain: Secondary | ICD-10-CM | POA: Insufficient documentation

## 2020-01-31 DIAGNOSIS — R109 Unspecified abdominal pain: Secondary | ICD-10-CM

## 2020-01-31 LAB — POCT URINALYSIS DIP (MANUAL ENTRY)
Bilirubin, UA: NEGATIVE
Glucose, UA: NEGATIVE mg/dL
Ketones, POC UA: NEGATIVE mg/dL
Leukocytes, UA: NEGATIVE
Nitrite, UA: NEGATIVE
Protein Ur, POC: NEGATIVE mg/dL
Spec Grav, UA: 1.02 (ref 1.010–1.025)
Urobilinogen, UA: 0.2 E.U./dL
pH, UA: 7 (ref 5.0–8.0)

## 2020-01-31 LAB — POCT URINE PREGNANCY: Preg Test, Ur: NEGATIVE

## 2020-01-31 MED ORDER — KETOROLAC TROMETHAMINE 30 MG/ML IJ SOLN
30.0000 mg | Freq: Once | INTRAMUSCULAR | Status: AC
  Administered 2020-01-31: 30 mg via INTRAMUSCULAR

## 2020-01-31 NOTE — ED Triage Notes (Signed)
Pt had painful abd cramping  And low back pain that started last night.  Denies any vaginal bleeding.

## 2020-01-31 NOTE — ED Provider Notes (Signed)
[facility]   [id number] 01/31/20 Arrival Time: 0805  CC: ABDOMINAL PAIN  SUBJECTIVE:  Elaine Hernandez is a 25 y.o. female who presents with low abdominal cramping and low back pain since last night. Denies a precipitating event, trauma, close contacts with similar symptoms, recent travel or antibiotic use. Has not taken OTC medications for this. Denies alleviating or aggravating factors. Denies similar symptoms in the past.     Denies fever, chills, appetite changes, weight changes, nausea, vomiting, chest pain, SOB, diarrhea, constipation, hematochezia, melena, dysuria, difficulty urinating, increased frequency or urgency, flank pain, loss of bowel or bladder function, vaginal discharge, vaginal odor, vaginal bleeding, dyspareunia, pelvic pain.     Patient's last menstrual period was 01/15/2020.  ROS: As per HPI.  All other pertinent ROS negative.     Past Medical History:  Diagnosis Date  . Headache   . Hypertension   . Panic attacks   . PONV (postoperative nausea and vomiting)    Past Surgical History:  Procedure Laterality Date  . CYST EXCISION Left 01/25/2019   Procedure: CYST REMOVAL;  Surgeon: Lucretia Roers, MD;  Location: [facility];  Service: General;  Laterality: Left;  . FOREIGN BODY REMOVAL Left 09/30/2015   Procedure: FOREIGN BODY REMOVAL ADULT;  Surgeon: Vickki Hearing, MD;  Location: [facility];  Service: Orthopedics;  Laterality: Left;  LEFT HAND  . WISDOM TOOTH EXTRACTION Bilateral    Allergies  Allergen Reactions  . Amoxicillin Hives    Did it involve swelling of the face/tongue/throat, SOB, or low BP? No Did it involve sudden or severe rash/hives, skin peeling, or any reaction on the inside of your mouth or nose? No Did you need to seek medical attention at a hospital or doctor's office? Yes When did it last happen?2011  If all above answers are "NO", may proceed with cephalosporin use.   . Cefdinir Hives    Possible Patient is unsure  .  Other Other (See Comments)    Citrus fruits. Tongue and throat burning.  . Smz-Tmp Ds [Sulfamethoxazole-Trimethoprim] Hives   No current facility-administered medications on file prior to encounter.   Current Outpatient Medications on File Prior to Encounter  Medication Sig Dispense Refill  . hydrochlorothiazide (MICROZIDE) 12.5 MG capsule Take 12.5 mg by mouth daily.     Marland Kitchen ibuprofen (ADVIL) 800 MG tablet Take 1 tablet (800 mg total) by mouth 3 (three) times daily. (Patient not taking: Reported on 07/11/2019) 21 tablet 0  . Multiple Vitamin (MULTIVITAMIN WITH MINERALS) TABS tablet Take 2 tablets by mouth daily. Women's One-A-Day     . [DISCONTINUED] amLODipine (NORVASC) 5 MG tablet Take 1 tablet (5 mg total) by mouth daily. 30 tablet 6  . [DISCONTINUED] norethindrone (MICRONOR,CAMILA,ERRIN) 0.35 MG tablet Take 1 tablet (0.35 mg total) by mouth daily. 1 Package 11   Social History   Socioeconomic History  . Marital status: Single    Spouse name: Not on file  . Number of children: Not on file  . Years of education: Not on file  . Highest education level: Not on file  Occupational History  . Not on file  Tobacco Use  . Smoking status: Never Smoker  . Smokeless tobacco: Never Used  Vaping Use  . Vaping Use: Never used  Substance and Sexual Activity  . Alcohol use: Yes    Comment: occasional  . Drug use: No  . Sexual activity: Yes    Birth control/protection: Condom, None  Other Topics Concern  . Not  on file  Social History Narrative  . Not on file   Social Determinants of Health   Financial Resource Strain: Not on file  Food Insecurity: No Food Insecurity  . Worried About Programme researcher, broadcasting/film/video in the Last Year: Never true  . Ran Out of Food in the Last Year: Never true  Transportation Needs: Not on file  Physical Activity: Not on file  Stress: Not on file  Social Connections: Not on file  Intimate Partner Violence: Not At Risk  . Fear of Current or Ex-Partner: No  .  Emotionally Abused: No  . Physically Abused: No  . Sexually Abused: No   Family History  Problem Relation Age of Onset  . Hypertension Mother   . Hypertension Father   . Cancer Paternal Grandfather        lung  . Cancer Paternal Grandmother        lung  . Hypertension Maternal Grandmother   . Alcohol abuse Maternal Grandfather      OBJECTIVE:  Vitals:   01/31/20 0826 01/31/20 0832  BP: (!) 145/107   Pulse: (!) 102   Resp: 17   Temp: 98.6 F (37 C)   TempSrc: Oral   SpO2: 98%   Weight:  190 lb (86.2 kg)  Height:  5\' 4"  (1.626 m)    General appearance: Alert; NAD HEENT: NCAT. Oropharynx clear.  Lungs: clear to auscultation bilaterally without adventitious breath sounds Heart: regular rate and rhythm. Radial pulses 2+ symmetrical bilaterally Abdomen: soft, non-distended; normal active bowel sounds; non-tender to light and deep palpation; nontender at McBurney's point; negative Murphy's sign; negative rebound; no guarding Back: no CVA tenderness Extremities: no edema; symmetrical with no gross deformities Skin: warm and dry Neurologic: normal gait Psychological: alert and cooperative; normal mood and affect  LABS: Results for orders placed or performed during the hospital encounter of 01/31/20 (from the past 24 hour(s))  POCT urinalysis dipstick     Status: Abnormal   Collection Time: 01/31/20  8:41 AM  Result Value Ref Range   Color, UA yellow yellow   Clarity, UA clear clear   Glucose, UA negative negative mg/dL   Bilirubin, UA negative negative   Ketones, POC UA negative negative mg/dL   Spec Grav, UA 02/02/20 1.287 - 1.025   Blood, UA trace-intact (A) negative   pH, UA 7.0 5.0 - 8.0   Protein Ur, POC negative negative mg/dL   Urobilinogen, UA 0.2 0.2 or 1.0 E.U./dL   Nitrite, UA Negative Negative   Leukocytes, UA Negative Negative  POCT urine pregnancy     Status: None   Collection Time: 01/31/20  8:41 AM  Result Value Ref Range   Preg Test, Ur Negative  Negative    DIAGNOSTIC STUDIES: DG Abdomen 1 View  Result Date: 01/31/2020 CLINICAL DATA:  Abdominal pain decreased bowel sounds EXAM: ABDOMEN - 1 VIEW COMPARISON:  None. FINDINGS: The bowel gas pattern is normal. No radio-opaque calculi or other significant radiographic abnormality are seen. IMPRESSION: Negative. Electronically Signed   By: 02/02/2020 M.D.   On: 01/31/2020 09:11     ASSESSMENT & PLAN:  1. Constipation, unspecified constipation type   2. RLQ abdominal pain     Meds ordered this encounter  Medications  . ketorolac (TORADOL) 30 MG/ML injection 30 mg   Toradol given in office today Xray today shows stool in the colon in the area of pain Negative pregnancy test today UA unremarkable today Cytology swab obtained  Will inform of  abnormal results and treat accordingly  If you experience new or worsening symptoms return or go to ER such as fever, chills, nausea, vomiting, diarrhea, bloody or dark tarry stools, constipation, urinary symptoms, worsening abdominal discomfort, symptoms that do not improve with medications, inability to keep fluids down.  Reviewed expectations re: course of current medical issues. Questions answered. Outlined signs and symptoms indicating need for more acute intervention. Patient verbalized understanding. After Visit Summary given.   Moshe Cipro, NP 01/31/20 1952

## 2020-01-31 NOTE — Discharge Instructions (Addendum)
Xray shows stool in the colon  You may try miralax with plenty of fluids. I would have you take 2 capfuls when you get it and 2 capfuls tomorrow to help get things moving well  Follow up with this office or with primary care if symptoms are persisting.  Follow up in the ER for high fever, trouble swallowing, trouble breathing, other concerning symptoms.

## 2020-02-03 LAB — CERVICOVAGINAL ANCILLARY ONLY
Bacterial Vaginitis (gardnerella): POSITIVE — AB
Candida Glabrata: NEGATIVE
Candida Vaginitis: POSITIVE — AB
Chlamydia: NEGATIVE
Comment: NEGATIVE
Comment: NEGATIVE
Comment: NEGATIVE
Comment: NEGATIVE
Comment: NEGATIVE
Comment: NORMAL
Neisseria Gonorrhea: NEGATIVE
Trichomonas: NEGATIVE

## 2020-02-04 ENCOUNTER — Telehealth (HOSPITAL_COMMUNITY): Payer: Self-pay | Admitting: Emergency Medicine

## 2020-02-04 MED ORDER — METRONIDAZOLE 500 MG PO TABS: 500.0000 mg | ORAL_TABLET | Freq: Two times a day (BID) | ORAL | 0 refills | Status: DC

## 2020-02-04 MED ORDER — FLUCONAZOLE 150 MG PO TABS: 150.0000 mg | ORAL_TABLET | Freq: Once | ORAL | 0 refills | Status: AC

## 2020-02-14 ENCOUNTER — Ambulatory Visit (INDEPENDENT_AMBULATORY_CARE_PROVIDER_SITE_OTHER): Payer: No Typology Code available for payment source | Admitting: Women's Health

## 2020-02-14 ENCOUNTER — Other Ambulatory Visit: Payer: Self-pay

## 2020-02-14 ENCOUNTER — Encounter: Payer: Self-pay | Admitting: Women's Health

## 2020-02-14 VITALS — BP 150/101 | HR 82 | Ht 64.0 in | Wt 186.4 lb

## 2020-02-14 DIAGNOSIS — M542 Cervicalgia: Secondary | ICD-10-CM

## 2020-02-14 DIAGNOSIS — M549 Dorsalgia, unspecified: Secondary | ICD-10-CM

## 2020-02-14 DIAGNOSIS — M25512 Pain in left shoulder: Secondary | ICD-10-CM

## 2020-02-14 DIAGNOSIS — M25511 Pain in right shoulder: Secondary | ICD-10-CM

## 2020-02-14 DIAGNOSIS — N62 Hypertrophy of breast: Secondary | ICD-10-CM | POA: Diagnosis not present

## 2020-02-14 DIAGNOSIS — Z01419 Encounter for gynecological examination (general) (routine) without abnormal findings: Secondary | ICD-10-CM

## 2020-02-14 DIAGNOSIS — G8929 Other chronic pain: Secondary | ICD-10-CM

## 2020-02-14 NOTE — Patient Instructions (Signed)
Dr. Louisa Second [email or web address] [address] [place], [place] [address]  ~17.8 mi [phone]   Contogiannis Chales Abrahams MD [facility] [address] [place], [place], [place] [address]  ~20.7 mi [phone]

## 2020-02-14 NOTE — Progress Notes (Signed)
WELL-WOMAN EXAMINATION Patient name: Elaine Hernandez MRN [id number]  Date of birth: [birth year] Chief Complaint:   Gynecologic Exam  History of Present Illness:   Elaine Hernandez is a 25 y.o. G0P0 African American female being seen today for a routine well-woman exam.  Current complaints: intermittent RLQ pain, went to urgent care 1/21, no evidence of appendicitis, xray showed stool, CV swab +BV and yeast and was treated, sx not improved. No fever/chills/n/v. Periods regular, x 7d, heavy first few days, changes pad q 2-3hrs, +cramping.   38H bra size, back neck and shoulder pain, shoulder indentions from bra, can't run for exercise, affecting daily life.   Depression screen [facility] 2/9 02/14/2020 04/29/2019 03/09/2018  Decreased Interest 0 0 0  Down, Depressed, Hopeless 0 - 0  PHQ - 2 Score 0 0 0  Altered sleeping 0 0 -  Tired, decreased energy 2 0 -  Change in appetite 0 0 -  Feeling bad or failure about yourself  0 0 -  Trouble concentrating 0 0 -  Moving slowly or fidgety/restless 0 0 -  Suicidal thoughts 0 0 -  PHQ-9 Score 2 0 -  Difficult doing work/chores - Not difficult at all -     PCP: Dr. Margo Aye      does not desire labs Patient's last menstrual period was 02/13/2020 (exact date). The current method of family planning is condoms.  Last pap 03/09/18. Results were: NILM w/ HRHPV not done. H/O abnormal pap: no Last mammogram: never. Results were: N/A. Family h/o breast cancer: no Last colonoscopy: never. Results were: N/A. Family h/o colorectal cancer: no Review of Systems:   Pertinent items are noted in HPI Denies any headaches, blurred vision, fatigue, shortness of breath, chest pain, abdominal pain, abnormal vaginal discharge/itching/odor/irritation, problems with periods, bowel movements, urination, or intercourse unless otherwise stated above. Pertinent History Reviewed:  Reviewed past medical,surgical, social and family history.  Reviewed problem list, medications and  allergies. Physical Assessment:   Vitals:   02/14/20 1023  BP: (!) 150/101  Pulse: 82  Weight: 186 lb 6.4 oz (84.6 kg)  Height: 5\' 4"  (1.626 m)  Body mass index is 32 kg/m.        Physical Examination: by , SNP  General appearance - well appearing, and in no distress  Mental status - alert, oriented to person, place, and time  Psych:  She has a normal mood and affect  Skin - warm and dry, normal color, no suspicious lesions noted  Chest - effort normal, all lung fields clear to auscultation bilaterally  Heart - normal rate and regular rhythm  Neck:  midline trachea, no thyromegaly or nodules  Breasts - breasts appear normal, no suspicious masses, no skin or nipple changes or  axillary nodes  Abdomen - soft, nondistended, no masses or organomegaly, +RLQ tenderness to deep palpation, no rebound  Pelvic - VULVA: normal appearing vulva with no masses, tenderness or lesions  VAGINA: normal appearing vagina with normal color and discharge, no lesions  CERVIX: normal appearing cervix without discharge or lesions, no CMT  Thin prep pap is not done  UTERUS: uterus is felt to be normal size, shape, consistency and nontender   ADNEXA: +tenderness Rt, normal Lt, no masses  Extremities:  No swelling or varicosities noted  Chaperone: me    No results found for this or any previous visit (from the past 24 hour(s)).  Assessment & Plan:  1) Well-Woman Exam  2) Intermittent RLQ pain> low likelihood  for appendicitis, will get pelvic u/s and f/u after  3) Large breasts w/ back/neck/shoulder pain> and inability to run for exercise, affecting daily life, wants reduction. Gave numbers to [facility]  Labs/procedures today: exam  Mammogram @27yo  or sooner if problems Colonoscopy @25yo  or sooner if problems  No orders of the defined types were placed in this encounter.   Meds: No orders of the defined types were placed in this encounter.   Follow-up: Return for 1st  available, US:GYN and f/u w/ me after.  CNM, [facility] 02/14/2020 11:45 AM

## 2020-02-17 ENCOUNTER — Telehealth: Payer: Self-pay

## 2020-02-17 NOTE — Telephone Encounter (Signed)
What else the pt needs to do for her breast reduction if a call could be made to pt directing her on what to next, she stated she will be having her consultation at [facility].  [address] [phone](phone) [phone](fax)

## 2020-03-09 ENCOUNTER — Other Ambulatory Visit: Payer: Self-pay | Admitting: Women's Health

## 2020-03-09 DIAGNOSIS — R102 Pelvic and perineal pain: Secondary | ICD-10-CM

## 2020-03-10 ENCOUNTER — Other Ambulatory Visit: Payer: Self-pay

## 2020-03-10 ENCOUNTER — Encounter: Payer: Self-pay | Admitting: Women's Health

## 2020-03-10 ENCOUNTER — Ambulatory Visit (INDEPENDENT_AMBULATORY_CARE_PROVIDER_SITE_OTHER): Payer: No Typology Code available for payment source

## 2020-03-10 ENCOUNTER — Ambulatory Visit (INDEPENDENT_AMBULATORY_CARE_PROVIDER_SITE_OTHER): Payer: No Typology Code available for payment source | Admitting: Women's Health

## 2020-03-10 VITALS — BP 150/103 | HR 81 | Ht 64.5 in | Wt 182.0 lb

## 2020-03-10 DIAGNOSIS — R102 Pelvic and perineal pain: Secondary | ICD-10-CM

## 2020-03-10 DIAGNOSIS — Z30011 Encounter for initial prescription of contraceptive pills: Secondary | ICD-10-CM

## 2020-03-10 LAB — POCT URINE PREGNANCY: Preg Test, Ur: NEGATIVE

## 2020-03-10 MED ORDER — NORETHINDRONE 0.35 MG PO TABS: 1.0000 | ORAL_TABLET | Freq: Every day | ORAL | 11 refills | Status: DC

## 2020-03-10 NOTE — Progress Notes (Signed)
GYN VISIT Patient name: Elaine Hernandez MRN [id number]  Date of birth: 09-Feb-1995 Chief Complaint:   Follow-up  History of Present Illness:   Elaine Hernandez is a 24 y.o. G0P0 African American female being seen today for f/u after pelvic u/s for RLQ pain since Jan. Went to urgent care 1/21, xray normal.  CV swab w/ me 1/21 +BV and yeast, pain did not improve after tx. Pain is now intermittent, and nowhere near as bad as it was in Jan. Normal bm's. No pain w/ sex. Small amt tenderness during today's u/s. No fever/chills, etc.  Wants to get on birth control. Currently has uncontrolled HTN-on HCTZ 12.5mg , reviewed methods, wants POPs.  Depression screen [facility] 2/9 02/14/2020 04/29/2019 03/09/2018  Decreased Interest 0 0 0  Down, Depressed, Hopeless 0 - 0  PHQ - 2 Score 0 0 0  Altered sleeping 0 0 -  Tired, decreased energy 2 0 -  Change in appetite 0 0 -  Feeling bad or failure about yourself  0 0 -  Trouble concentrating 0 0 -  Moving slowly or fidgety/restless 0 0 -  Suicidal thoughts 0 0 -  PHQ-9 Score 2 0 -  Difficult doing work/chores - Not difficult at all -    Patient's last menstrual period was 02/13/2020 (exact date). The current method of family planning is none.  Last pap 03/09/18. Results were: NILM w/ HRHPV not done Review of Systems:   Pertinent items are noted in HPI Denies fever/chills, dizziness, headaches, visual disturbances, fatigue, shortness of breath, chest pain, abdominal pain, vomiting, abnormal vaginal discharge/itching/odor/irritation, problems with periods, bowel movements, urination, or intercourse unless otherwise stated above.  Pertinent History Reviewed:  Reviewed past medical,surgical, social, obstetrical and family history.  Reviewed problem list, medications and allergies. Physical Assessment:   Vitals:   03/10/20 0906 03/10/20 0949  BP: (!) 146/94 (!) 150/103  Pulse: 81   Weight: 182 lb (82.6 kg)   Height: 5' 4.5" (1.638 m)   Body mass index is 30.76  kg/m.       Physical Examination:   General appearance: alert, well appearing, and in no distress  Mental status: alert, oriented to person, place, and time  Skin: warm & dry   Cardiovascular: normal heart rate noted  Respiratory: normal respiratory effort, no distress  Abdomen: soft, non-tender   Pelvic: examination not indicated  Extremities: no edema   Chaperone: N/A    Today's pelvic u/s:  JOSEPHENE MARRONE is a 25 y.o. G0P0 Patient's last menstrual period was 02/13/2020 (exact date). She is here for a pelvic sonogram for RLQ pain.  Uterus                      7.1 x 4.7 x 5.1 cm, Total uterine volume 89 cc,:homogeneous anteverted uterus,wnl Endometrium          5.2 mm, symmetrical, wnl Right ovary             2.9 x 1.6 x 2.8 cm, wnl Left ovary                3.2 x 2.2 x 2.9 cm, wnl No free fluid   Technician Comments: PELVIC US TA/TV:homogeneous anteverted uterus,wnl,EEC 5.2 mm,normal ovaries,ovaries appear mobile,no free fluid,right adnexal discomfort during ultrasound Chaperone [address] Flora Lipps 03/10/2020 8:58 AM  Results for orders placed or performed in visit on 03/10/20 (from the past 24 hour(s))  POCT urine pregnancy   Collection Time: 03/10/20  9:49 AM  Result Value Ref Range   Preg Test, Ur Negative Negative    Assessment & Plan:  1) Intermittent RLQ pain> normal pelvic u/s, does not appear to be gyn in etiology. If persists can f/u w/ PCP. If develops fever/chills/severe RLQ go to ED to be evaluated for appendicitis  2) Contraception management> Rx micronor w/ 11RF, understands has to take at exact same time daily to be effective, if late taking use condom as back-up   3) HTN> not well controlled on HCTZ 12.5mg , call PCP today to make appt  Meds:  Meds ordered this encounter  Medications  . norethindrone (MICRONOR) 0.35 MG tablet    Sig: Take 1 tablet (0.35 mg total) by mouth daily.    Dispense:  28 tablet    Refill:  11    Order Specific  Question:   Supervising Provider    Answer:   Duane Lope H [[id number]]    Orders Placed This Encounter  Procedures  . POCT urine pregnancy    Return in about 1 year (around 03/10/2021) for Pap & physical.  Cheral Marker CNM, [facility] 03/10/2020 9:55 AM

## 2020-03-10 NOTE — Progress Notes (Signed)
PELVIC US TA/TV:homogeneous anteverted uterus,wnl,EEC 5.2 mm,normal ovaries,ovaries appear mobile,no free fluid,right adnexal discomfort during ultrasound  Chaperone Peggy

## 2020-03-10 NOTE — Patient Instructions (Signed)

## 2020-03-12 ENCOUNTER — Telehealth: Payer: Self-pay

## 2020-03-12 NOTE — Telephone Encounter (Signed)
Pt called with some questions concerning her BC and when to start taking the first dose. Instructed her to start taking them this Sunday since she began her period yesterday. She was told to use a backup method, condoms, during the entire first pack. Pt confirms understanding.

## 2020-03-19 ENCOUNTER — Emergency Department (HOSPITAL_COMMUNITY)
Admission: EM | Admit: 2020-03-19 | Discharge: 2020-03-20 | Disposition: A | Discharge status: Home / Self Care | Payer: No Typology Code available for payment source | Attending: Emergency Medicine | Admitting: Emergency Medicine

## 2020-03-19 ENCOUNTER — Other Ambulatory Visit: Payer: Self-pay

## 2020-03-19 ENCOUNTER — Encounter (HOSPITAL_COMMUNITY): Payer: Self-pay | Admitting: Emergency Medicine

## 2020-03-19 DIAGNOSIS — N3091 Cystitis, unspecified with hematuria: Secondary | ICD-10-CM | POA: Diagnosis not present

## 2020-03-19 DIAGNOSIS — T502X5A Adverse effect of carbonic-anhydrase inhibitors, benzothiadiazides and other diuretics, initial encounter: Secondary | ICD-10-CM | POA: Insufficient documentation

## 2020-03-19 DIAGNOSIS — R1030 Lower abdominal pain, unspecified: Secondary | ICD-10-CM | POA: Diagnosis present

## 2020-03-19 DIAGNOSIS — I10 Essential (primary) hypertension: Secondary | ICD-10-CM | POA: Diagnosis not present

## 2020-03-19 DIAGNOSIS — Z79899 Other long term (current) drug therapy: Secondary | ICD-10-CM | POA: Insufficient documentation

## 2020-03-19 DIAGNOSIS — E876 Hypokalemia: Secondary | ICD-10-CM | POA: Diagnosis not present

## 2020-03-19 NOTE — ED Triage Notes (Signed)
Pt c/o lower back pain and abdominal pain, and hematuria.

## 2020-03-20 ENCOUNTER — Emergency Department (HOSPITAL_COMMUNITY): Payer: No Typology Code available for payment source

## 2020-03-20 LAB — URINALYSIS, ROUTINE W REFLEX MICROSCOPIC
RBC / HPF: >50 RBC/hpf — ABNORMAL HIGH (ref 0–5)
WBC, UA: >50 WBC/hpf — ABNORMAL HIGH (ref 0–5)

## 2020-03-20 LAB — COMPREHENSIVE METABOLIC PANEL
ALT: 16 U/L (ref 0–44)
AST: 28 U/L (ref 15–41)
Albumin: 4.2 g/dL (ref 3.5–5.0)
Alkaline Phosphatase: 80 U/L (ref 38–126)
Anion gap: 12 (ref 5–15)
BUN: 13 mg/dL (ref 6–20)
CO2: 22 mmol/L (ref 22–32)
Calcium: 9.1 mg/dL (ref 8.9–10.3)
Chloride: 102 mmol/L (ref 98–111)
Creatinine, Ser: 0.73 mg/dL (ref 0.44–1.00)
GFR, Estimated: >60 mL/min (ref >60)
Glucose, Bld: 93 mg/dL (ref 70–99)
Potassium: 3.2 mmol/L — ABNORMAL LOW (ref 3.5–5.1)
Sodium: 136 mmol/L (ref 135–145)
Total Bilirubin: 0.8 mg/dL (ref 0.3–1.2)
Total Protein: 8.1 g/dL (ref 6.5–8.1)

## 2020-03-20 LAB — CBC WITH DIFFERENTIAL/PLATELET
Abs Immature Granulocytes: 0.08 10*3/uL — ABNORMAL HIGH (ref 0.00–0.07)
Basophils Absolute: 0.1 10*3/uL (ref 0.0–0.1)
Basophils Relative: 1 %
Eosinophils Absolute: 0.2 10*3/uL (ref 0.0–0.5)
Eosinophils Relative: 1 %
HCT: 38.8 % (ref 36.0–46.0)
Hemoglobin: 12.1 g/dL (ref 12.0–15.0)
Immature Granulocytes: 1 %
Lymphocytes Relative: 21 %
Lymphs Abs: 3.2 10*3/uL (ref 0.7–4.0)
MCH: 25.4 pg — ABNORMAL LOW (ref 26.0–34.0)
MCHC: 31.2 g/dL (ref 30.0–36.0)
MCV: 81.5 fL (ref 80.0–100.0)
Monocytes Absolute: 0.9 10*3/uL (ref 0.1–1.0)
Monocytes Relative: 6 %
Neutro Abs: 11 10*3/uL — ABNORMAL HIGH (ref 1.7–7.7)
Neutrophils Relative %: 70 %
Platelets: 368 10*3/uL (ref 150–400)
RBC: 4.76 MIL/uL (ref 3.87–5.11)
RDW: 15.3 % (ref 11.5–15.5)
WBC: 15.4 10*3/uL — ABNORMAL HIGH (ref 4.0–10.5)
nRBC: 0 % (ref 0.0–0.2)

## 2020-03-20 LAB — POC URINE PREG, ED: Preg Test, Ur: NEGATIVE

## 2020-03-20 LAB — LIPASE, BLOOD: Lipase: 24 U/L (ref 11–51)

## 2020-03-20 MED ORDER — ONDANSETRON HCL 4 MG/2ML IJ SOLN
4.0000 mg | Freq: Once | INTRAMUSCULAR | Status: AC
  Administered 2020-03-20: 4 mg via INTRAVENOUS
  Filled 2020-03-20: qty 2

## 2020-03-20 MED ORDER — NITROFURANTOIN MONOHYD MACRO 100 MG PO CAPS: 100.0000 mg | ORAL_CAPSULE | Freq: Two times a day (BID) | ORAL | 0 refills | Status: DC

## 2020-03-20 MED ORDER — KETOROLAC TROMETHAMINE 30 MG/ML IJ SOLN
30.0000 mg | Freq: Once | INTRAMUSCULAR | Status: AC
  Administered 2020-03-20: 30 mg via INTRAVENOUS
  Filled 2020-03-20: qty 1

## 2020-03-20 MED ORDER — NITROFURANTOIN MACROCRYSTAL 100 MG PO CAPS
100.0000 mg | ORAL_CAPSULE | Freq: Once | ORAL | Status: AC
  Administered 2020-03-20: 100 mg via ORAL
  Filled 2020-03-20: qty 1

## 2020-03-20 MED ORDER — PHENAZOPYRIDINE HCL 200 MG PO TABS: 200.0000 mg | ORAL_TABLET | Freq: Three times a day (TID) | ORAL | 0 refills | Status: DC

## 2020-03-20 MED ORDER — POTASSIUM CHLORIDE CRYS ER 20 MEQ PO TBCR: 20.0000 meq | EXTENDED_RELEASE_TABLET | Freq: Every day | ORAL | 0 refills | Status: DC

## 2020-03-20 MED ORDER — POTASSIUM CHLORIDE CRYS ER 20 MEQ PO TBCR
40.0000 meq | EXTENDED_RELEASE_TABLET | Freq: Once | ORAL | Status: AC
  Administered 2020-03-20: 40 meq via ORAL
  Filled 2020-03-20: qty 2

## 2020-03-20 MED ORDER — PHENAZOPYRIDINE HCL 100 MG PO TABS
100.0000 mg | ORAL_TABLET | Freq: Once | ORAL | Status: DC
  Filled 2020-03-20: qty 1

## 2020-03-20 MED ORDER — PHENAZOPYRIDINE HCL 100 MG PO TABS
200.0000 mg | ORAL_TABLET | Freq: Once | ORAL | Status: AC
  Administered 2020-03-20: 200 mg via ORAL

## 2020-03-20 NOTE — Discharge Instructions (Addendum)
Return if you start running a fever or start vomiting.

## 2020-03-20 NOTE — ED Provider Notes (Addendum)
[facility] EMERGENCY DEPARTMENT Provider Note   CSN: [id number] Arrival date & time: 03/19/20  2226   History Chief Complaint  Patient presents with  . Abdominal Pain    Elaine Hernandez is a 25 y.o. female.  The history is provided by the patient.  Abdominal Pain She has history of hypertension and comes in complaining of lower back and lower abdominal pain which started this evening.  She felt like she had to urinate and then had severe pain across lower back and across the suprapubic area.  She had some urinary urgency and tenesmus but pain did not change after urinating.  She then had a sense that she had to move her bowels and she did have a bowel movement which did not affect the pain.  Pain has been constant but is somewhat better she sits on the toilet and somewhat better if she takes a hot shower.  She rates pain at 10/10.  She tried taking some NyQuil without any relief.  She then had a sense that she had to urinate and noted some blood so she came in for further evaluation.  She has never had pain like this before.  Last menses started on March 3 and was slightly later than expected.  She is not using any kind of contraception.  Past Medical History:  Diagnosis Date  . Headache   . Hypertension   . Panic attacks   . PONV (postoperative nausea and vomiting)     Patient Active Problem List   Diagnosis Date Noted  . Sebaceous cyst of left axilla 01/22/2019  . Chronic hypertension 02/01/2018  . Foreign body, hand, superficial     Past Surgical History:  Procedure Laterality Date  . CYST EXCISION Left 01/25/2019   Procedure: CYST REMOVAL;  Surgeon: Lucretia Roers, MD;  Location: [facility];  Service: General;  Laterality: Left;  . FOREIGN BODY REMOVAL Left 09/30/2015   Procedure: FOREIGN BODY REMOVAL ADULT;  Surgeon: Vickki Hearing, MD;  Location: [facility];  Service: Orthopedics;  Laterality: Left;  LEFT HAND  . WISDOM TOOTH EXTRACTION Bilateral      OB History     Gravida  0   Para  0   Term      Preterm      AB      Living        SAB      IAB      Ectopic      Multiple      Live Births              Family History  Problem Relation Age of Onset  . Hypertension Mother   . Hypertension Father   . Cancer Paternal Grandfather        lung  . Cancer Paternal Grandmother        lung  . Hypertension Maternal Grandmother   . Alcohol abuse Maternal Grandfather     Social History   Tobacco Use  . Smoking status: Never Smoker  . Smokeless tobacco: Never Used  Vaping Use  . Vaping Use: Never used  Substance Use Topics  . Alcohol use: Yes    Comment: occasional  . Drug use: No    Home Medications Prior to Admission medications   Medication Sig Start Date End Date Taking? Authorizing Provider  hydrochlorothiazide (MICROZIDE) 12.5 MG capsule Take 12.5 mg by mouth daily.     [provider]  Multiple Vitamin (MULTIVITAMIN WITH MINERALS) TABS tablet  Take 2 tablets by mouth daily. Women's One-A-Day     [provider]  norethindrone (MICRONOR) 0.35 MG tablet Take 1 tablet (0.35 mg total) by mouth daily. 03/10/20   Cheral Marker, CNM  amLODipine (NORVASC) 5 MG tablet Take 1 tablet (5 mg total) by mouth daily. 03/09/18 01/05/19  Adline Potter, NP    Allergies    Amoxicillin, Cefdinir, Other, and Smz-tmp ds [sulfamethoxazole-trimethoprim]  Review of Systems   Review of Systems  Gastrointestinal: Positive for abdominal pain.  All other systems reviewed and are negative.   Physical Exam Updated Vital Signs BP (!) 167/110   Pulse 93   Temp 98.2 F (36.8 C)   Resp 18   Ht 5\' 5"  (1.651 m)   Wt 82.6 kg   SpO2 98%   BMI 30.30 kg/m   Physical Exam Vitals and nursing note reviewed.   25 year old female, resting comfortably and in no acute distress. Vital signs are significant for elevated blood pressure. Oxygen saturation is 98%, which is normal. Head is normocephalic and atraumatic. PERRLA,  EOMI. Oropharynx is clear. Neck is nontender and supple without adenopathy or JVD. Back is mildly tender across the lower lumbar area.  There is no CVA tenderness. Lungs are clear without rales, wheezes, or rhonchi. Chest is nontender. Heart has regular rate and rhythm without murmur. Abdomen is soft, flat, with mild suprapubic tenderness.  There is no rebound or guarding.  There are no masses or hepatosplenomegaly and peristalsis is hypoactive. Extremities have no cyanosis or edema, full range of motion is present. Skin is warm and dry without rash. Neurologic: Mental status is normal, cranial nerves are intact, there are no motor or sensory deficits.  ED Results / Procedures / Treatments   Labs (all labs ordered are listed, but only abnormal results are displayed) Labs Reviewed  URINALYSIS, ROUTINE W REFLEX MICROSCOPIC - Abnormal; Notable for the following components:      Result Value   Color, Urine RED (*)    APPearance HAZY (*)    Glucose, UA   (*)    Value: TEST NOT REPORTED DUE TO COLOR INTERFERENCE OF URINE PIGMENT   Hgb urine dipstick   (*)    Value: TEST NOT REPORTED DUE TO COLOR INTERFERENCE OF URINE PIGMENT   Bilirubin Urine   (*)    Value: TEST NOT REPORTED DUE TO COLOR INTERFERENCE OF URINE PIGMENT   Ketones, ur   (*)    Value: TEST NOT REPORTED DUE TO COLOR INTERFERENCE OF URINE PIGMENT   Protein, ur   (*)    Value: TEST NOT REPORTED DUE TO COLOR INTERFERENCE OF URINE PIGMENT   Nitrite   (*)    Value: TEST NOT REPORTED DUE TO COLOR INTERFERENCE OF URINE PIGMENT   Leukocytes,Ua   (*)    Value: TEST NOT REPORTED DUE TO COLOR INTERFERENCE OF URINE PIGMENT   RBC / HPF >50 (*)    WBC, UA >50 (*)    Bacteria, UA FEW (*)    All other components within normal limits  CBC WITH DIFFERENTIAL/PLATELET - Abnormal; Notable for the following components:   WBC 15.4 (*)    MCH 25.4 (*)    Neutro Abs 11.0 (*)    Abs Immature Granulocytes 0.08 (*)    All other components within  normal limits  COMPREHENSIVE METABOLIC PANEL - Abnormal; Notable for the following components:   Potassium 3.2 (*)    All other components within normal limits  URINE  CULTURE  LIPASE, BLOOD  POC URINE PREG, ED    Radiology CT Renal Stone Study  Result Date: 03/20/2020 CLINICAL DATA:  Flank pain and hematuria EXAM: CT ABDOMEN AND PELVIS WITHOUT CONTRAST TECHNIQUE: Multidetector CT imaging of the abdomen and pelvis was performed following the standard protocol without IV contrast. COMPARISON:  08/21/2015 FINDINGS: LOWER CHEST: Normal. HEPATOBILIARY: Normal hepatic contours. No intra- or extrahepatic biliary dilatation. Normal gallbladder. PANCREAS: Normal pancreas. No ductal dilatation or peripancreatic fluid collection. SPLEEN: Normal. ADRENALS/URINARY TRACT: The adrenal glands are normal. No hydronephrosis, nephroureterolithiasis or solid renal mass. The urinary bladder is normal for degree of distention STOMACH/BOWEL: There is no hiatal hernia. Normal duodenal course and caliber. No small bowel dilatation or inflammation. No focal colonic abnormality. Normal appendix. VASCULAR/LYMPHATIC: Normal course and caliber of the major abdominal vessels. No abdominal or pelvic lymphadenopathy. REPRODUCTIVE: Normal uterus. No adnexal mass. MUSCULOSKELETAL. No bony spinal canal stenosis or focal osseous abnormality. OTHER: None. IMPRESSION: Normal CT of the abdomen and pelvis. Electronically Signed   By: Deatra Robinson M.D.   On: 03/20/2020 01:05    Procedures Procedures   Medications Ordered in ED Medications  nitrofurantoin (MACRODANTIN) capsule 100 mg (has no administration in time range)  potassium chloride SA (KLOR-CON) CR tablet 40 mEq (has no administration in time range)  phenazopyridine (PYRIDIUM) tablet 200 mg (has no administration in time range)  ondansetron (ZOFRAN) injection 4 mg (4 mg Intravenous Given 03/20/20 0046)  ketorolac (TORADOL) 30 MG/ML injection 30 mg (30 mg Intravenous Given  03/20/20 0046)    ED Course  I have reviewed the triage vital signs and the nursing notes.  Pertinent labs & imaging results that were available during my care of the patient were reviewed by me and considered in my medical decision making (see chart for details).  MDM Rules/Calculators/A&P Urinary symptoms with low back pain and suprapubic pain.  This is likely urinary tract infection but will send for CT scanning to evaluate for possible urolithiasis.  Old records are reviewed, and she does have a prior ED visit for right lower quadrant pain which was felt to be secondary to constipation.  CT scan is unremarkable.  WBC is mildly elevated but with no left shift.  Pregnancy test is negative.  Electrolytes show hypokalemia which is probably related to diuretic use.  Urinalysis is consistent with hemorrhagic cystitis with greater than 50 RBCs, greater than 50 WBCs, WBC clumps present.  She is given a dose of oral potassium, nitrofurantoin, denies any pyridine and is sent home with prescriptions for all of these.  Return precautions discussed.  Final Clinical Impression(s) / ED Diagnoses Final diagnoses:  Hemorrhagic cystitis  Elevated blood pressure reading with diagnosis of hypertension  Diuretic-induced hypokalemia    Rx / DC Orders ED Discharge Orders         Ordered    potassium chloride SA (KLOR-CON) 20 MEQ tablet  Daily        03/20/20 0225    phenazopyridine (PYRIDIUM) 200 MG tablet  3 times daily        03/20/20 0226    nitrofurantoin, macrocrystal-monohydrate, (MACROBID) 100 MG capsule  2 times daily        03/20/20 0234           Dione Booze, MD 03/20/20 Lindie Spruce    Dione Booze, MD 03/20/20 [phone]

## 2020-03-23 LAB — URINE CULTURE: Culture: 80000 — AB

## 2020-10-21 ENCOUNTER — Ambulatory Visit
Admission: EM | Admit: 2020-10-21 | Discharge: 2020-10-21 | Disposition: A | Discharge status: Home / Self Care | Payer: No Typology Code available for payment source | Attending: Family Medicine | Admitting: Family Medicine

## 2020-10-21 ENCOUNTER — Other Ambulatory Visit: Payer: Self-pay

## 2020-10-21 DIAGNOSIS — Z9189 Other specified personal risk factors, not elsewhere classified: Secondary | ICD-10-CM | POA: Diagnosis not present

## 2020-10-21 DIAGNOSIS — J069 Acute upper respiratory infection, unspecified: Secondary | ICD-10-CM

## 2020-10-21 MED ORDER — DEXAMETHASONE SODIUM PHOSPHATE 10 MG/ML IJ SOLN
10.0000 mg | Freq: Once | INTRAMUSCULAR | Status: AC
  Administered 2020-10-21: 10 mg via INTRAMUSCULAR

## 2020-10-21 MED ORDER — PROMETHAZINE-DM 6.25-15 MG/5ML PO SYRP: 5.0000 mL | ORAL_SOLUTION | Freq: Four times a day (QID) | ORAL | 0 refills | Status: DC | PRN

## 2020-10-21 MED ORDER — FLUTICASONE PROPIONATE 50 MCG/ACT NA SUSP: 1.0000 | Freq: Two times a day (BID) | NASAL | 2 refills | Status: DC

## 2020-10-21 NOTE — ED Provider Notes (Signed)
[facility]    CSN: [id number] Arrival date & time: 10/21/20  6734      History   Chief Complaint Chief Complaint  Patient presents with   Nasal Congestion   Generalized Body Aches    HPI Elaine Hernandez is a 25 y.o. female.   Patient presenting today with 2-day history of nasal congestion, sinus pain and pressure, sore throat, bilateral ear pain, generalized body aches.  Denies known fever, chills, chest pain, shortness of breath, abdominal pain, nausea vomiting or diarrhea.  So far taking NyQuil off-and-on with minimal relief.  No known history of allergies or asthma.  States several family members had the flu several weeks ago so she is wanting to get checked for this.   Past Medical History:  Diagnosis Date   Headache    Hypertension    Panic attacks    PONV (postoperative nausea and vomiting)     Patient Active Problem List   Diagnosis Date Noted   Sebaceous cyst of left axilla 01/22/2019   Chronic hypertension 02/01/2018   Foreign body, hand, superficial     Past Surgical History:  Procedure Laterality Date   CYST EXCISION Left 01/25/2019   Procedure: CYST REMOVAL;  Surgeon: Lucretia Roers, MD;  Location: [facility];  Service: General;  Laterality: Left;   FOREIGN BODY REMOVAL Left 09/30/2015   Procedure: FOREIGN BODY REMOVAL ADULT;  Surgeon: Vickki Hearing, MD;  Location: [facility];  Service: Orthopedics;  Laterality: Left;  LEFT HAND   WISDOM TOOTH EXTRACTION Bilateral     OB History     Gravida  0   Para  0   Term      Preterm      AB      Living         SAB      IAB      Ectopic      Multiple      Live Births               Home Medications    Prior to Admission medications   Medication Sig Start Date End Date Taking? Authorizing Provider  fluticasone (FLONASE) 50 MCG/ACT nasal spray Place 1 spray into both nostrils 2 (two) times daily. 10/21/20  Yes Particia Nearing, PA-C  promethazine-dextromethorphan  (PROMETHAZINE-DM) 6.25-15 MG/5ML syrup Take 5 mLs by mouth 4 (four) times daily as needed for cough. 10/21/20  Yes Particia Nearing, PA-C  hydrochlorothiazide (MICROZIDE) 12.5 MG capsule Take 12.5 mg by mouth daily.     [provider]  Multiple Vitamin (MULTIVITAMIN WITH MINERALS) TABS tablet Take 2 tablets by mouth daily. Women's One-A-Day     [provider]  nitrofurantoin, macrocrystal-monohydrate, (MACROBID) 100 MG capsule Take 1 capsule (100 mg total) by mouth 2 (two) times daily. 03/20/20   Dione Booze, MD  norethindrone (MICRONOR) 0.35 MG tablet Take 1 tablet (0.35 mg total) by mouth daily. 03/10/20   Cheral Marker, CNM  phenazopyridine (PYRIDIUM) 200 MG tablet Take 1 tablet (200 mg total) by mouth 3 (three) times daily. 03/20/20   Dione Booze, MD  potassium chloride SA (KLOR-CON) 20 MEQ tablet Take 1 tablet (20 mEq total) by mouth daily. 03/20/20   Dione Booze, MD  amLODipine (NORVASC) 5 MG tablet Take 1 tablet (5 mg total) by mouth daily. 03/09/18 01/05/19  Adline Potter, NP    Family History Family History  Problem Relation Age of Onset   Hypertension Mother  Hypertension Father    Cancer Paternal Grandfather        lung   Cancer Paternal Grandmother        lung   Hypertension Maternal Grandmother    Alcohol abuse Maternal Grandfather     Social History Social History   Tobacco Use   Smoking status: Never   Smokeless tobacco: Never  Vaping Use   Vaping Use: Never used  Substance Use Topics   Alcohol use: Yes    Comment: occasional   Drug use: No     Allergies   Amoxicillin, Cefdinir, Other, and Smz-tmp ds [sulfamethoxazole-trimethoprim]   Review of Systems Review of Systems Per HPI  Physical Exam Triage Vital Signs ED Triage Vitals  Enc Vitals Group     BP 10/21/20 1005 (!) 155/101     Pulse Rate 10/21/20 1005 88     Resp 10/21/20 1005 18     Temp 10/21/20 1005 98.5 F (36.9 C)     Temp Source 10/21/20 1005 Oral      SpO2 10/21/20 1005 98 %     Weight --      Height --      Head Circumference --      Peak Flow --      Pain Score 10/21/20 1009 6     Pain Loc --      Pain Edu? --      Excl. in [place]? --    No data found.  Updated Vital Signs BP (!) 155/101 (BP Location: Right Arm) Comment: No BP meds taken today  Pulse 88   Temp 98.5 F (36.9 C) (Oral)   Resp 18   LMP 10/18/2020 (Exact Date)   SpO2 98%   Visual Acuity Right Eye Distance:   Left Eye Distance:   Bilateral Distance:    Right Eye Near:   Left Eye Near:    Bilateral Near:     Physical Exam Vitals and nursing note reviewed.  Constitutional:      Appearance: Normal appearance. She is not ill-appearing.  HENT:     Head: Atraumatic.     Right Ear: Tympanic membrane normal.     Left Ear: Tympanic membrane normal.     Nose: Rhinorrhea present.     Comments: Significant nasal turbinate edema    Mouth/Throat:     Mouth: Mucous membranes are moist.     Pharynx: Posterior oropharyngeal erythema present.  Eyes:     Extraocular Movements: Extraocular movements intact.     Conjunctiva/sclera: Conjunctivae normal.  Cardiovascular:     Rate and Rhythm: Normal rate and regular rhythm.     Heart sounds: Normal heart sounds.  Pulmonary:     Effort: Pulmonary effort is normal.     Breath sounds: Normal breath sounds. No wheezing or rales.  Musculoskeletal:        General: Normal range of motion.     Cervical back: Normal range of motion and neck supple.  Skin:    General: Skin is warm and dry.  Neurological:     Mental Status: She is alert and oriented to person, place, and time.  Psychiatric:        Mood and Affect: Mood normal.        Thought Content: Thought content normal.        Judgment: Judgment normal.   UC Treatments / Results  Labs (all labs ordered are listed, but only abnormal results are displayed) Labs Reviewed  COVID-19, FLU A+B NAA  EKG   Radiology No results found.  Procedures Procedures  (including critical care time)  Medications Ordered in UC Medications  dexamethasone (DECADRON) injection 10 mg (10 mg Intramuscular Given 10/21/20 1132)    Initial Impression / Assessment and Plan / UC Course  I have reviewed the triage vital signs and the nursing notes.  Pertinent labs & imaging results that were available during my care of the patient were reviewed by me and considered in my medical decision making (see chart for details).     Suspect viral illness, COVID and flu swab pending, will treat with IM Decadron, Flonase, Phenergan DM.  Discussed over-the-counter supportive medications and home care.  Work note given with quarantine protocol.  Return for acutely worsening symptoms.  Final Clinical Impressions(s) / UC Diagnoses   Final diagnoses:  At increased risk of exposure to COVID-19 virus  Viral URI with cough     Discharge Instructions      I have sent over Flonase and a cough syrup in case you need it but you do not have to pick it up if you do not want to.  We have given you a steroid shot today to help with your sinus inflammation and pressure.  Take over-the-counter pain relievers as needed for your body aches and recommend sinus rinses and other supportive home care to help with your symptoms.     ED Prescriptions     Medication Sig Dispense Auth. Provider   fluticasone (FLONASE) 50 MCG/ACT nasal spray Place 1 spray into both nostrils 2 (two) times daily. 18 g Roosvelt Maser [place], [place]   promethazine-dextromethorphan (PROMETHAZINE-DM) 6.25-15 MG/5ML syrup Take 5 mLs by mouth 4 (four) times daily as needed for cough. 100 mL Particia Nearing, [place]      PDMP not reviewed this encounter.   Particia Nearing, [place] 10/21/20 1156

## 2020-10-21 NOTE — Discharge Instructions (Signed)
I have sent over Flonase and a cough syrup in case you need it but you do not have to pick it up if you do not want to.  We have given you a steroid shot today to help with your sinus inflammation and pressure.  Take over-the-counter pain relievers as needed for your body aches and recommend sinus rinses and other supportive home care to help with your symptoms.

## 2020-10-21 NOTE — ED Triage Notes (Signed)
Two day h/o congestion, body aches and onset last night of sinus pressure and ear pain. Has been taking nyquil without relief. Pt is not covid vaccinated. Dad and friend has recently been sick.

## 2020-10-22 LAB — COVID-19, FLU A+B NAA
Influenza A, NAA: NOT DETECTED
Influenza B, NAA: NOT DETECTED
SARS-CoV-2, NAA: NOT DETECTED

## 2020-11-06 ENCOUNTER — Emergency Department (HOSPITAL_COMMUNITY): Payer: No Typology Code available for payment source

## 2020-11-06 ENCOUNTER — Emergency Department (HOSPITAL_COMMUNITY)
Admission: EM | Admit: 2020-11-06 | Discharge: 2020-11-07 | Disposition: A | Discharge status: Home / Self Care | Payer: No Typology Code available for payment source | Attending: Emergency Medicine | Admitting: Emergency Medicine

## 2020-11-06 ENCOUNTER — Encounter (HOSPITAL_COMMUNITY): Payer: Self-pay | Admitting: Emergency Medicine

## 2020-11-06 DIAGNOSIS — R0789 Other chest pain: Secondary | ICD-10-CM | POA: Insufficient documentation

## 2020-11-06 DIAGNOSIS — Z5321 Procedure and treatment not carried out due to patient leaving prior to being seen by health care provider: Secondary | ICD-10-CM | POA: Diagnosis not present

## 2020-11-06 NOTE — ED Notes (Signed)
Waiting room called x 2 no answer

## 2020-11-06 NOTE — ED Triage Notes (Signed)
Chest pain in center of chest  that started this morning that has continued to get worse throughout the day.  Some relief after belching and hurts worse when she moves certain ways.

## 2020-11-23 ENCOUNTER — Ambulatory Visit: Payer: Self-pay

## 2020-11-23 ENCOUNTER — Ambulatory Visit
Admission: EM | Admit: 2020-11-23 | Discharge: 2020-11-23 | Disposition: A | Discharge status: Home / Self Care | Payer: No Typology Code available for payment source | Attending: Family Medicine | Admitting: Family Medicine

## 2020-11-23 ENCOUNTER — Other Ambulatory Visit: Payer: Self-pay

## 2020-11-23 DIAGNOSIS — R509 Fever, unspecified: Secondary | ICD-10-CM

## 2020-11-23 DIAGNOSIS — R000 Tachycardia, unspecified: Secondary | ICD-10-CM | POA: Diagnosis not present

## 2020-11-23 DIAGNOSIS — J101 Influenza due to other identified influenza virus with other respiratory manifestations: Secondary | ICD-10-CM | POA: Diagnosis not present

## 2020-11-23 LAB — POCT INFLUENZA A/B
Influenza A, POC: POSITIVE — AB
Influenza B, POC: NEGATIVE

## 2020-11-23 MED ORDER — OSELTAMIVIR PHOSPHATE 75 MG PO CAPS: 75.0000 mg | ORAL_CAPSULE | Freq: Two times a day (BID) | ORAL | 0 refills | Status: DC

## 2020-11-23 MED ORDER — PROMETHAZINE-DM 6.25-15 MG/5ML PO SYRP: 5.0000 mL | ORAL_SOLUTION | Freq: Four times a day (QID) | ORAL | 0 refills | Status: DC | PRN

## 2020-11-23 MED ORDER — ACETAMINOPHEN 325 MG PO TABS
650.0000 mg | ORAL_TABLET | Freq: Once | ORAL | Status: AC
  Administered 2020-11-23: 650 mg via ORAL

## 2020-11-23 MED ORDER — IBUPROFEN 800 MG PO TABS: 800.0000 mg | ORAL_TABLET | Freq: Three times a day (TID) | ORAL | 0 refills | Status: DC | PRN

## 2020-11-23 NOTE — ED Triage Notes (Signed)
Pt presents with c/o fever, body aches and sore throat with cough that began yesterday

## 2020-11-23 NOTE — ED Provider Notes (Signed)
[facility]    CSN: [id number] Arrival date & time: 11/23/20  0835      History   Chief Complaint Chief Complaint  Patient presents with   Fever   Generalized Body Aches   Sore Throat    HPI Elaine Hernandez is a 25 y.o. female.   Presenting today with 1 day history of fever, chills, body aches, sore throat, cough, congestion, fatigue, weakness.  Denies chest pain, shortness of breath, abdominal pain, nausea vomiting or diarrhea.  So far taking DayQuil, NyQuil with minimal temporary relief.  No pertinent past medical history.  No known sick contacts recently.   Past Medical History:  Diagnosis Date   Headache    Hypertension    Panic attacks    PONV (postoperative nausea and vomiting)     Patient Active Problem List   Diagnosis Date Noted   Sebaceous cyst of left axilla 01/22/2019   Chronic hypertension 02/01/2018   Foreign body, hand, superficial     Past Surgical History:  Procedure Laterality Date   CYST EXCISION Left 01/25/2019   Procedure: CYST REMOVAL;  Surgeon: Virl Cagey, MD;  Location: [facility];  Service: General;  Laterality: Left;   FOREIGN BODY REMOVAL Left 09/30/2015   Procedure: FOREIGN BODY REMOVAL ADULT;  Surgeon: Carole Civil, MD;  Location: [facility];  Service: Orthopedics;  Laterality: Left;  LEFT HAND   WISDOM TOOTH EXTRACTION Bilateral     OB History     Gravida  0   Para  0   Term      Preterm      AB      Living         SAB      IAB      Ectopic      Multiple      Live Births               Home Medications    Prior to Admission medications   Medication Sig Start Date End Date Taking? Authorizing Provider  ibuprofen (ADVIL) 800 MG tablet Take 1 tablet (800 mg total) by mouth every 8 (eight) hours as needed. 11/23/20  Yes Volney American, PA-C  oseltamivir (TAMIFLU) 75 MG capsule Take 1 capsule (75 mg total) by mouth every 12 (twelve) hours. 11/23/20  Yes Volney American, PA-C   promethazine-dextromethorphan (PROMETHAZINE-DM) 6.25-15 MG/5ML syrup Take 5 mLs by mouth 4 (four) times daily as needed for cough. 11/23/20  Yes Volney American, PA-C  fluticasone [facility]) 50 MCG/ACT nasal spray Place 1 spray into both nostrils 2 (two) times daily. 10/21/20   Volney American, PA-C  hydrochlorothiazide (MICROZIDE) 12.5 MG capsule Take 12.5 mg by mouth daily.     [provider]  Multiple Vitamin (MULTIVITAMIN WITH MINERALS) TABS tablet Take 2 tablets by mouth daily. Women's One-A-Day     [provider]  nitrofurantoin, macrocrystal-monohydrate, (MACROBID) 100 MG capsule Take 1 capsule (100 mg total) by mouth 2 (two) times daily. 0000000   Delora Fuel, MD  norethindrone (MICRONOR) 0.35 MG tablet Take 1 tablet (0.35 mg total) by mouth daily. 03/10/20   Roma Schanz, CNM  phenazopyridine (PYRIDIUM) 200 MG tablet Take 1 tablet (200 mg total) by mouth 3 (three) times daily. 0000000   Delora Fuel, MD  potassium chloride SA (KLOR-CON) 20 MEQ tablet Take 1 tablet (20 mEq total) by mouth daily. 0000000   Delora Fuel, MD  promethazine-dextromethorphan (PROMETHAZINE-DM) 6.25-15 MG/5ML syrup Take 5  mLs by mouth 4 (four) times daily as needed for cough. 10/21/20   Volney American, PA-C  amLODipine (NORVASC) 5 MG tablet Take 1 tablet (5 mg total) by mouth daily. 03/09/18 01/05/19  Estill Dooms, NP    Family History Family History  Problem Relation Age of Onset   Hypertension Mother    Hypertension Father    Cancer Paternal Grandfather        lung   Cancer Paternal Grandmother        lung   Hypertension Maternal Grandmother    Alcohol abuse Maternal Grandfather     Social History Social History   Tobacco Use   Smoking status: Never   Smokeless tobacco: Never  Vaping Use   Vaping Use: Never used  Substance Use Topics   Alcohol use: Yes    Comment: occasional   Drug use: No     Allergies   Amoxicillin, Cefdinir, Other,  and Smz-tmp ds [sulfamethoxazole-trimethoprim]   Review of Systems Review of Systems Per HPI  Physical Exam Triage Vital Signs ED Triage Vitals  Enc Vitals Group     BP 11/23/20 0940 (!) 149/99     Pulse Rate 11/23/20 0940 (!) 127     Resp 11/23/20 0940 20     Temp 11/23/20 0940 (!) 102.6 F (39.2 C)     Temp src --      SpO2 11/23/20 0940 97 %     Weight --      Height --      Head Circumference --      Peak Flow --      Pain Score 11/23/20 0938 9     Pain Loc --      Pain Edu? --      Excl. in [place]? --    No data found.  Updated Vital Signs BP (!) 149/99   Pulse (!) 127   Temp (!) 102.6 F (39.2 C)   Resp 20   SpO2 97%   Visual Acuity Right Eye Distance:   Left Eye Distance:   Bilateral Distance:    Right Eye Near:   Left Eye Near:    Bilateral Near:     Physical Exam Vitals and nursing note reviewed.  Constitutional:      Appearance: Normal appearance. She is not ill-appearing.  HENT:     Head: Atraumatic.     Right Ear: Tympanic membrane normal.     Left Ear: Tympanic membrane normal.     Nose: Rhinorrhea present.     Mouth/Throat:     Mouth: Mucous membranes are moist.     Pharynx: Posterior oropharyngeal erythema present. No oropharyngeal exudate.  Eyes:     Extraocular Movements: Extraocular movements intact.     Conjunctiva/sclera: Conjunctivae normal.  Cardiovascular:     Rate and Rhythm: Normal rate and regular rhythm.     Heart sounds: Normal heart sounds.  Pulmonary:     Effort: Pulmonary effort is normal.     Breath sounds: Normal breath sounds. No wheezing or rales.  Abdominal:     General: Bowel sounds are normal. There is no distension.     Palpations: Abdomen is soft.     Tenderness: There is no abdominal tenderness. There is no guarding.  Musculoskeletal:        General: Normal range of motion.     Cervical back: Normal range of motion and neck supple.  Skin:    General: Skin is warm and dry.  Neurological:  Mental  Status: She is alert and oriented to person, place, and time.  Psychiatric:        Mood and Affect: Mood normal.        Thought Content: Thought content normal.        Judgment: Judgment normal.   UC Treatments / Results  Labs (all labs ordered are listed, but only abnormal results are displayed) Labs Reviewed  POCT INFLUENZA A/B - Abnormal; Notable for the following components:      Result Value   Influenza A, POC Positive (*)    All other components within normal limits    EKG   Radiology No results found.  Procedures Procedures (including critical care time)  Medications Ordered in UC Medications  acetaminophen (TYLENOL) tablet 650 mg (650 mg Oral Given 11/23/20 0955)    Initial Impression / Assessment and Plan / UC Course  I have reviewed the triage vital signs and the nursing notes.  Pertinent labs & imaging results that were available during my care of the patient were reviewed by me and considered in my medical decision making (see chart for details).     Significantly febrile and tachycardic in triage, otherwise vital signs reassuring.  Tylenol given in triage for fever.  Suspect tachycardia secondary to fever.  Rapid flu test positive, will start Tamiflu, Phenergan DM.  Supportive over-the-counter medications and home care reviewed.  Return for acutely worsening symptoms.  Work note given.  Final Clinical Impressions(s) / UC Diagnoses   Final diagnoses:  Influenza A  Fever, unspecified  Tachycardia   Discharge Instructions   None    ED Prescriptions     Medication Sig Dispense Auth. Provider   oseltamivir (TAMIFLU) 75 MG capsule Take 1 capsule (75 mg total) by mouth every 12 (twelve) hours. 10 capsule Particia Nearing, [place]   promethazine-dextromethorphan (PROMETHAZINE-DM) 6.25-15 MG/5ML syrup Take 5 mLs by mouth 4 (four) times daily as needed for cough. 100 mL Particia Nearing, [place]   ibuprofen (ADVIL) 800 MG tablet Take 1 tablet (800 mg  total) by mouth every 8 (eight) hours as needed. 30 tablet Particia Nearing, [place]      PDMP not reviewed this encounter.   Particia Nearing, [place] 11/23/20 1012

## 2021-04-12 ENCOUNTER — Ambulatory Visit (INDEPENDENT_AMBULATORY_CARE_PROVIDER_SITE_OTHER): Payer: No Typology Code available for payment source | Admitting: Adult Health

## 2021-04-12 ENCOUNTER — Encounter: Payer: Self-pay | Admitting: Adult Health

## 2021-04-12 ENCOUNTER — Other Ambulatory Visit (HOSPITAL_COMMUNITY)
Admission: RE | Admit: 2021-04-12 | Discharge: 2021-04-12 | Disposition: A | Discharge status: Home / Self Care | Payer: No Typology Code available for payment source | Source: Ambulatory Visit | Attending: Adult Health | Admitting: Adult Health

## 2021-04-12 VITALS — BP 150/102 | HR 85 | Ht 64.0 in | Wt 199.0 lb

## 2021-04-12 DIAGNOSIS — N9489 Other specified conditions associated with female genital organs and menstrual cycle: Secondary | ICD-10-CM

## 2021-04-12 DIAGNOSIS — N920 Excessive and frequent menstruation with regular cycle: Secondary | ICD-10-CM | POA: Diagnosis not present

## 2021-04-12 DIAGNOSIS — Z124 Encounter for screening for malignant neoplasm of cervix: Secondary | ICD-10-CM | POA: Diagnosis present

## 2021-04-12 DIAGNOSIS — I10 Essential (primary) hypertension: Secondary | ICD-10-CM | POA: Diagnosis not present

## 2021-04-12 DIAGNOSIS — Z3202 Encounter for pregnancy test, result negative: Secondary | ICD-10-CM

## 2021-04-12 LAB — POCT URINE PREGNANCY: Preg Test, Ur: NEGATIVE

## 2021-04-12 MED ORDER — MEGESTROL ACETATE 40 MG PO TABS: ORAL_TABLET | ORAL | 0 refills | Status: DC

## 2021-04-12 NOTE — Progress Notes (Signed)
?  Subjective:  ?  ? Patient ID: Elaine Hernandez, female   DOB: 28-Nov-1995, 28 y.o.   MRN: [id number] ? ?HPI ?Elaine Hernandez is a 26 year old black female,single, G0P0 in complaining of last 2 periods and had more clots, has headache and backache with period, and some cramping. She is not sexually active at present. ?She was lightheaded in shower.  And she needs a pap.  ?PCP is Dr Margo Aye. ? ?Review of Systems ?For last 2 months bleeding has been heavier and has more clots and cramps ?Was lightheaded in shower ?Not sexually active at present ?Has headache and back aches with period ?Reviewed past medical,surgical, social and family history. Reviewed medications and allergies.  ?   ?Objective:  ? Physical Exam ?BP (!) 150/102 (BP Location: Left Arm, Patient Position: Sitting, Cuff Size: Large)   Pulse 85   Ht 5\' 4"  (1.626 m)   Wt 199 lb (90.3 kg)   LMP 04/10/2021   BMI 34.16 kg/m?  UPT is negative. ?Skin warm and dry. Lungs: clear to ausculation bilaterally. Cardiovascular: regular rate and rhythm.  ?  Pelvic: external genitalia is normal in appearance no lesions, vagina: +dark period blood,urethra has no lesions or masses noted, cervix:smooth and bulbous,pap with GC/CHL and HR HPV genotyping performed.  uterus: normal size, shape and contour, non tender, no masses felt, adnexa: no masses or tenderness noted. Bladder is non tender and no masses felt. ?Fall risk is low ? [place] - 04/12/21 1404   ? ?  ? Pregnancy Intention Screening  ? Does the patient want to become pregnant in the next year? No   ? Does the patient's partner want to become pregnant in the next year? No   ? Would the patient like to discuss contraceptive options today? No   ?  ? Contraception Wrap Up  ? Current Method Female Condom;Abstinence   ? End Method Female Condom;Abstinence   ? Contraception Counseling Provided No   ? ?  ?  ? ?  ? Examination chaperoned by 06/12/21 LPN ? ?Assessment:  ?   ?1. Routine cervical smear ?Pap sent  ?- Cytology - PAP( [facility]) ? ?2. Pregnancy examination or test, negative result ? ?- POCT urine pregnancy ? ?3. Menorrhagia with regular cycle ?Will rx megace to stop bleeding.may consider lysteda  ?- CBC ?- Comprehensive metabolic panel ?- TSH ?She declines birth control for now ? ?4. Chronic hypertension ?Take BP meds when gets home ?Follow up with PCP ? ?5. Uterine cramping ? ?   ?Plan:  ?   ?Follow up in 4 weeks for ROS  ?   ?

## 2021-04-13 LAB — COMPREHENSIVE METABOLIC PANEL
ALT: 24 IU/L (ref 0–32)
AST: 26 IU/L (ref 0–40)
Albumin/Globulin Ratio: 1.5 (ref 1.2–2.2)
Albumin: 4.6 g/dL (ref 3.9–5.0)
Alkaline Phosphatase: 88 IU/L (ref 44–121)
BUN/Creatinine Ratio: 16 (ref 9–23)
BUN: 11 mg/dL (ref 6–20)
Bilirubin Total: <0.2 mg/dL (ref 0.0–1.2)
CO2: 24 mmol/L (ref 20–29)
Calcium: 10.2 mg/dL (ref 8.7–10.2)
Chloride: 95 mmol/L — ABNORMAL LOW (ref 96–106)
Creatinine, Ser: 0.68 mg/dL (ref 0.57–1.00)
Globulin, Total: 3 g/dL (ref 1.5–4.5)
Glucose: 96 mg/dL (ref 70–99)
Potassium: 3.4 mmol/L — ABNORMAL LOW (ref 3.5–5.2)
Sodium: 136 mmol/L (ref 134–144)
Total Protein: 7.6 g/dL (ref 6.0–8.5)
eGFR: 122 mL/min/{1.73_m2} (ref >59)

## 2021-04-13 LAB — CBC
Hematocrit: 37.8 % (ref 34.0–46.6)
Hemoglobin: 12.5 g/dL (ref 11.1–15.9)
MCH: 25.2 pg — ABNORMAL LOW (ref 26.6–33.0)
MCHC: 33.1 g/dL (ref 31.5–35.7)
MCV: 76 fL — ABNORMAL LOW (ref 79–97)
Platelets: 369 10*3/uL (ref 150–450)
RBC: 4.96 x10E6/uL (ref 3.77–5.28)
RDW: 15.4 % (ref 11.7–15.4)
WBC: 12.2 10*3/uL — ABNORMAL HIGH (ref 3.4–10.8)

## 2021-04-13 LAB — TSH: TSH: 1.08 u[IU]/mL (ref 0.450–4.500)

## 2021-04-16 LAB — CYTOLOGY - PAP
Chlamydia: NEGATIVE
Comment: NEGATIVE
Comment: NEGATIVE
Comment: NORMAL
Diagnosis: NEGATIVE
Diagnosis: REACTIVE
High risk HPV: NEGATIVE
Neisseria Gonorrhea: NEGATIVE

## 2021-04-20 ENCOUNTER — Ambulatory Visit
Admission: EM | Admit: 2021-04-20 | Discharge: 2021-04-20 | Disposition: A | Discharge status: Home / Self Care | Payer: No Typology Code available for payment source | Attending: Urgent Care | Admitting: Urgent Care

## 2021-04-20 DIAGNOSIS — L299 Pruritus, unspecified: Secondary | ICD-10-CM

## 2021-04-20 DIAGNOSIS — T63441A Toxic effect of venom of bees, accidental (unintentional), initial encounter: Secondary | ICD-10-CM | POA: Diagnosis not present

## 2021-04-20 MED ORDER — HYDROXYZINE HCL 25 MG PO TABS: 12.5000 mg | ORAL_TABLET | Freq: Three times a day (TID) | ORAL | 0 refills | Status: DC | PRN

## 2021-04-20 MED ORDER — TRIAMCINOLONE ACETONIDE 0.1 % EX CREA: 1.0000 "application " | TOPICAL_CREAM | Freq: Two times a day (BID) | CUTANEOUS | 0 refills | Status: DC

## 2021-04-20 NOTE — ED Triage Notes (Signed)
Pt reports she was stung by a bee under left eye. Reports face is itching. Denies trouble breathing, talking, swallowing. Pt is concern as her father is allergic to bees.  ?

## 2021-04-20 NOTE — ED Provider Notes (Signed)
?[facility] ? ? ?MRN: [id number] DOB: 11/23/1995 ? ?Subjective:  ? ?Elaine Hernandez is a 26 y.o. female presenting for an evaluation following a bee sting to the left lower eyelid that occurred about 1 hour ago.  Patient reported mild pain initially that resolved very quickly.  She subsequently developed itching and wanted to be evaluated to make sure she was not having a serious reaction.  Previously her dad had a very severe allergic reaction that was likely anaphylaxis that she describes swelling, itching, needing management through the emergency room.  The patient herself denies any particular swelling, throat closing sensation, shortness of breath nausea, vomiting, abdominal pain, fevers.  She did want to take Benadryl but wanted to make sure she got evaluated first. ? ?No current facility-administered medications for this encounter. ? ?Current Outpatient Medications:  ?  hydrochlorothiazide (HYDRODIURIL) 25 MG tablet, Take 25 mg by mouth daily., Disp: , Rfl:  ?  megestrol (MEGACE) 40 MG tablet, Take 3 x 5 days then 2 x 5 days then 1 daily til bleeding stops, Disp: 45 tablet, Rfl: 0 ?  Multiple Vitamin (MULTIVITAMIN WITH MINERALS) TABS tablet, Take 2 tablets by mouth daily. Women's One-A-Day , Disp: , Rfl:   ? ? ?Allergies  ?Allergen Reactions  ? Amoxicillin Hives  ?  Did it involve swelling of the face/tongue/throat, SOB, or low BP? No ?Did it involve sudden or severe rash/hives, skin peeling, or any reaction on the inside of your mouth or nose? No ?Did you need to seek medical attention at a hospital or doctor's office? Yes ?When did it last happen? 2011 ?      ?If all above answers are ?NO?, may proceed with cephalosporin use. ?  ? Cefdinir Hives  ?  Possible Patient is unsure  ? Other Other (See Comments)  ?  Citrus fruits. Tongue and throat burning.  ? Smz-Tmp Ds [Sulfamethoxazole-Trimethoprim] Hives  ?  Bactrim ?  ? ? ?Past Medical History:  ?Diagnosis Date  ? Headache   ? Hypertension    ? Panic attacks   ? PONV (postoperative nausea and vomiting)   ?  ? ?Past Surgical History:  ?Procedure Laterality Date  ? CYST EXCISION Left 01/25/2019  ? Procedure: CYST REMOVAL;  Surgeon: Lucretia Roers, MD;  Location: [facility];  Service: General;  Laterality: Left;  ? FOREIGN BODY REMOVAL Left 09/30/2015  ? Procedure: FOREIGN BODY REMOVAL ADULT;  Surgeon: Vickki Hearing, MD;  Location: [facility];  Service: Orthopedics;  Laterality: Left;  LEFT HAND  ? WISDOM TOOTH EXTRACTION Bilateral   ? ? ?Family History  ?Problem Relation Age of Onset  ? Hypertension Mother   ? Hypertension Father   ? Cancer Paternal Grandfather   ?     lung  ? Cancer Paternal Grandmother   ?     lung  ? Hypertension Maternal Grandmother   ? Alcohol abuse Maternal Grandfather   ? ? ?Social History  ? ?Tobacco Use  ? Smoking status: Never  ? Smokeless tobacco: Never  ?Vaping Use  ? Vaping Use: Never used  ?Substance Use Topics  ? Alcohol use: Yes  ?  Comment: occasional  ? Drug use: No  ? ? ?ROS ? ? ?Objective:  ? ?Vitals: ?BP (!) 166/91 (BP Location: Right Arm)   Pulse 83   Temp 98.7 ?F (37.1 ?C) (Oral)   Resp 18   LMP 04/10/2021 (Exact Date)   SpO2 97%  ? ?Physical Exam ?Constitutional:   ?  General: She is not in acute distress. ?   Appearance: Normal appearance. She is well-developed and normal weight. She is not ill-appearing, toxic-appearing or diaphoretic.  ?HENT:  ?   Head: Normocephalic and atraumatic.  ?   Right Ear: Tympanic membrane, ear canal and external ear normal. No drainage or tenderness. No middle ear effusion. There is no impacted cerumen. Tympanic membrane is not erythematous.  ?   Left Ear: Tympanic membrane, ear canal and external ear normal. No drainage or tenderness.  No middle ear effusion. There is no impacted cerumen. Tympanic membrane is not erythematous.  ?   Nose: Nose normal. No congestion or rhinorrhea.  ?   Mouth/Throat:  ?   Mouth: Mucous membranes are moist. No oral lesions.  ?   Pharynx: No  pharyngeal swelling, oropharyngeal exudate, posterior oropharyngeal erythema or uvula swelling.  ?   Tonsils: No tonsillar exudate or tonsillar abscesses.  ?   Comments: Airway is patent, patient is controlling secretions and speaking in full sentences.  ?Eyes:  ?   General: Vision grossly intact. No scleral icterus.    ?   Right eye: No foreign body, discharge or hordeolum.     ?   Left eye: No foreign body, discharge or hordeolum.  ?   Extraocular Movements: Extraocular movements intact.  ?   Right eye: Normal extraocular motion.  ?   Left eye: Normal extraocular motion.  ?   Conjunctiva/sclera:  ?   Right eye: Right conjunctiva is not injected. No chemosis, exudate or hemorrhage. ?   Left eye: Left conjunctiva is not injected. No chemosis, exudate or hemorrhage. ? ?Cardiovascular:  ?   Rate and Rhythm: Normal rate.  ?   Heart sounds: No murmur heard. ?  No friction rub. No gallop.  ?Pulmonary:  ?   Effort: Pulmonary effort is normal. No tachypnea, accessory muscle usage or respiratory distress.  ?   Breath sounds: No stridor or decreased air movement. No decreased breath sounds, wheezing, rhonchi or rales.  ?Chest:  ?   Chest wall: No tenderness.  ?Musculoskeletal:  ?   Cervical back: Normal range of motion and neck supple.  ?Lymphadenopathy:  ?   Cervical: No cervical adenopathy.  ?Skin: ?   General: Skin is warm and dry.  ?Neurological:  ?   General: No focal deficit present.  ?   Mental Status: She is alert and oriented to person, place, and time.  ?Psychiatric:     ?   Mood and Affect: Mood normal.     ?   Behavior: Behavior normal.  ? ? ?Assessment and Plan :  ? ?PDMP not reviewed this encounter. ? ?1. Itching   ?2. Bee sting, accidental or unintentional, initial encounter   ? ?Low suspicion for an anaphylactic reaction.  Recommended conservative management using triamcinolone cream, hydroxyzine.  Discussed possibility that a worsening reaction can develop over time, reviewed signs and symptoms of this.  Counseled patient on potential for adverse effects with medications prescribed/recommended today, ER and return-to-clinic precautions discussed, patient verbalized understanding. ? ?  ?Wallis Bamberg, PA-C ?04/20/21 1454 ? ?

## 2021-05-10 ENCOUNTER — Ambulatory Visit: Payer: No Typology Code available for payment source | Admitting: Adult Health

## 2021-06-16 ENCOUNTER — Ambulatory Visit
Admission: EM | Admit: 2021-06-16 | Discharge: 2021-06-16 | Disposition: A | Discharge status: Home / Self Care | Payer: No Typology Code available for payment source | Attending: Family Medicine | Admitting: Family Medicine

## 2021-06-16 ENCOUNTER — Encounter: Payer: Self-pay | Admitting: Emergency Medicine

## 2021-06-16 DIAGNOSIS — S29011A Strain of muscle and tendon of front wall of thorax, initial encounter: Secondary | ICD-10-CM

## 2021-06-16 DIAGNOSIS — M549 Dorsalgia, unspecified: Secondary | ICD-10-CM

## 2021-06-16 MED ORDER — CYCLOBENZAPRINE HCL 5 MG PO TABS: 5.0000 mg | ORAL_TABLET | Freq: Three times a day (TID) | ORAL | 0 refills | Status: DC | PRN

## 2021-06-16 MED ORDER — NAPROXEN 500 MG PO TABS: 500.0000 mg | ORAL_TABLET | Freq: Two times a day (BID) | ORAL | 0 refills | Status: DC | PRN

## 2021-06-16 MED ORDER — KETOROLAC TROMETHAMINE 30 MG/ML IJ SOLN
30.0000 mg | Freq: Once | INTRAMUSCULAR | Status: AC
  Administered 2021-06-16: 30 mg via INTRAMUSCULAR

## 2021-06-16 NOTE — ED Triage Notes (Signed)
Upper chest pain that started yesterday after playing in a pool and lifting children in the pool.  Pain feels better when she lays flat.  Tried pulling something apart earlier today and felt pain while pulling.

## 2021-06-16 NOTE — ED Provider Notes (Signed)
[facility]    CSN: [id number] Arrival date & time: 06/16/21  1712      History   Chief Complaint Chief Complaint  Patient presents with   ? pulled muscle    HPI Elaine Hernandez is a 26 y.o. female.   Patient presenting today with 1 day history of midline chest pulling sensation, upper back soreness after playing with several children at the pool yesterday, lifting them and swimming with them.  She states the pain is much worse with movement.  Denies upper extremity numbness, tingling, swelling, radiation of pain, chest pain, shortness of breath, nausea, vomiting, diaphoresis, dizziness, headache.  Took some BC powder earlier today which seemed to ease it off slightly but the stiffness has been getting worse.   Past Medical History:  Diagnosis Date   Headache    Hypertension    Panic attacks    PONV (postoperative nausea and vomiting)     Patient Active Problem List   Diagnosis Date Noted   Menorrhagia with regular cycle 04/12/2021   Pregnancy examination or test, negative result 04/12/2021   Routine cervical smear 04/12/2021   Uterine cramping 04/12/2021   Sebaceous cyst of left axilla 01/22/2019   Chronic hypertension 02/01/2018   Foreign body, hand, superficial     Past Surgical History:  Procedure Laterality Date   CYST EXCISION Left 01/25/2019   Procedure: CYST REMOVAL;  Surgeon: Lucretia RoersBridges, Lindsay C, MD;  Location: [facility];  Service: General;  Laterality: Left;   FOREIGN BODY REMOVAL Left 09/30/2015   Procedure: FOREIGN BODY REMOVAL ADULT;  Surgeon: Vickki HearingStanley E Harrison, MD;  Location: [facility];  Service: Orthopedics;  Laterality: Left;  LEFT HAND   WISDOM TOOTH EXTRACTION Bilateral     OB History     Gravida  0   Para  0   Term      Preterm      AB      Living         SAB      IAB      Ectopic      Multiple      Live Births               Home Medications    Prior to Admission medications   Medication Sig Start Date End Date  Taking? Authorizing Provider  cyclobenzaprine (FLEXERIL) 5 MG tablet Take 1 tablet (5 mg total) by mouth 3 (three) times daily as needed for muscle spasms. Do not drink alcohol or drive while taking this medication.  May cause drowsiness. 06/16/21  Yes Particia NearingLane, Ragan Duhon Elizabeth, PA-C  naproxen (NAPROSYN) 500 MG tablet Take 1 tablet (500 mg total) by mouth 2 (two) times daily as needed. 06/16/21  Yes Particia NearingLane, Merica Prell Elizabeth, PA-C  hydrochlorothiazide (HYDRODIURIL) 25 MG tablet Take 25 mg by mouth daily.    [provider]  hydrOXYzine (ATARAX) 25 MG tablet Take 0.5-1 tablets (12.5-25 mg total) by mouth every 8 (eight) hours as needed for itching. 04/20/21   Wallis BambergMani, Mario, PA-C  megestrol (MEGACE) 40 MG tablet Take 3 x 5 days then 2 x 5 days then 1 daily til bleeding stops 04/12/21   Adline PotterGriffin, Jennifer A, NP  Multiple Vitamin (MULTIVITAMIN WITH MINERALS) TABS tablet Take 2 tablets by mouth daily. Women's One-A-Day     [provider]  triamcinolone cream (KENALOG) 0.1 % Apply 1 application. topically 2 (two) times daily. 04/20/21   Wallis BambergMani, Mario, PA-C  amLODipine (NORVASC) 5 MG tablet Take 1  tablet (5 mg total) by mouth daily. 03/09/18 01/05/19  Adline Potter, NP    Family History Family History  Problem Relation Age of Onset   Hypertension Mother    Hypertension Father    Cancer Paternal Grandfather        lung   Cancer Paternal Grandmother        lung   Hypertension Maternal Grandmother    Alcohol abuse Maternal Grandfather     Social History Social History   Tobacco Use   Smoking status: Never   Smokeless tobacco: Never  Vaping Use   Vaping Use: Never used  Substance Use Topics   Alcohol use: Yes    Comment: occasional   Drug use: No     Allergies   Amoxicillin, Cefdinir, Other, and Smz-tmp ds [sulfamethoxazole-trimethoprim]   Review of Systems Review of Systems Per HPI  Physical Exam Triage Vital Signs ED Triage Vitals  Enc Vitals Group     BP 06/16/21 1808  (!) 154/88     Pulse Rate 06/16/21 1808 75     Resp 06/16/21 1808 18     Temp 06/16/21 1808 98.2 F (36.8 C)     Temp Source 06/16/21 1808 Oral     SpO2 06/16/21 1808 99 %     Weight --      Height --      Head Circumference --      Peak Flow --      Pain Score 06/16/21 1809 8     Pain Loc --      Pain Edu? --      Excl. in GC? --    No data found.  Updated Vital Signs BP (!) 154/88 (BP Location: Right Arm)   Pulse 75   Temp 98.2 F (36.8 C) (Oral)   Resp 18   LMP 06/06/2021 (Exact Date)   SpO2 99%   Visual Acuity Right Eye Distance:   Left Eye Distance:   Bilateral Distance:    Right Eye Near:   Left Eye Near:    Bilateral Near:     Physical Exam Vitals and nursing note reviewed.  Constitutional:      Appearance: Normal appearance. She is not ill-appearing.  HENT:     Head: Atraumatic.  Eyes:     Extraocular Movements: Extraocular movements intact.     Conjunctiva/sclera: Conjunctivae normal.  Cardiovascular:     Rate and Rhythm: Normal rate and regular rhythm.     Heart sounds: Normal heart sounds.  Pulmonary:     Effort: Pulmonary effort is normal.     Breath sounds: Normal breath sounds.  Musculoskeletal:        General: Tenderness present. No swelling, deformity or signs of injury. Normal range of motion.     Cervical back: Normal range of motion and neck supple.     Comments: Minimal tenderness to palpation thoracic paraspinal muscles and peristernal region, worse with range of motion against resistance  Skin:    General: Skin is warm and dry.  Neurological:     Mental Status: She is alert and oriented to person, place, and time.     Motor: No weakness.     Gait: Gait normal.     Comments: Bilateral upper extremities neurovascular intact  Psychiatric:        Mood and Affect: Mood normal.        Thought Content: Thought content normal.        Judgment: Judgment normal.   UC  Treatments / Results  Labs (all labs ordered are listed, but only  abnormal results are displayed) Labs Reviewed - No data to display  EKG   Radiology No results found.  Procedures Procedures (including critical care time)  Medications Ordered in UC Medications  ketorolac (TORADOL) 30 MG/ML injection 30 mg (30 mg Intramuscular Given 06/16/21 1830)    Initial Impression / Assessment and Plan / UC Course  I have reviewed the triage vital signs and the nursing notes.  Pertinent labs & imaging results that were available during my care of the patient were reviewed by me and considered in my medical decision making (see chart for details).     Consistent with muscular strain, treat with naproxen, Flexeril and IM Toradol given in clinic per patient's request.  Return for worsening symptoms.  Final Clinical Impressions(s) / UC Diagnoses   Final diagnoses:  Pectoralis muscle strain, initial encounter  Upper back pain   Discharge Instructions   None    ED Prescriptions     Medication Sig Dispense Auth. Provider   naproxen (NAPROSYN) 500 MG tablet Take 1 tablet (500 mg total) by mouth 2 (two) times daily as needed. 30 tablet Particia Nearing, [place]   cyclobenzaprine (FLEXERIL) 5 MG tablet Take 1 tablet (5 mg total) by mouth 3 (three) times daily as needed for muscle spasms. Do not drink alcohol or drive while taking this medication.  May cause drowsiness. 15 tablet Particia Nearing, [place]      PDMP not reviewed this encounter.   Particia Nearing, [place] 06/16/21 [phone]

## 2021-06-28 ENCOUNTER — Other Ambulatory Visit: Payer: Self-pay | Admitting: Family Medicine

## 2021-06-28 NOTE — Telephone Encounter (Signed)
Urgent Care patient Requested Prescriptions  Pending Prescriptions Disp Refills  . naproxen (NAPROSYN) 500 MG tablet [Pharmacy Med Name: NAPROXEN 500MG  TABLETS] 30 tablet 0    Sig: TAKE 1 TABLET(500 MG) BY MOUTH TWICE DAILY AS NEEDED     There is no refill protocol information for this order

## 2021-09-14 ENCOUNTER — Other Ambulatory Visit (HOSPITAL_COMMUNITY): Payer: Self-pay | Admitting: Internal Medicine

## 2021-09-14 ENCOUNTER — Ambulatory Visit (HOSPITAL_COMMUNITY)
Admission: RE | Admit: 2021-09-14 | Discharge: 2021-09-14 | Disposition: A | Discharge status: Home / Self Care | Payer: No Typology Code available for payment source | Source: Ambulatory Visit | Attending: Internal Medicine | Admitting: Internal Medicine

## 2021-09-14 DIAGNOSIS — M25512 Pain in left shoulder: Secondary | ICD-10-CM

## 2021-09-15 ENCOUNTER — Ambulatory Visit: Payer: Self-pay

## 2021-09-29 ENCOUNTER — Ambulatory Visit (INDEPENDENT_AMBULATORY_CARE_PROVIDER_SITE_OTHER): Payer: No Typology Code available for payment source

## 2021-09-29 ENCOUNTER — Ambulatory Visit
Admission: RE | Admit: 2021-09-29 | Discharge: 2021-09-29 | Disposition: A | Discharge status: Home / Self Care | Payer: No Typology Code available for payment source | Source: Ambulatory Visit | Attending: Nurse Practitioner | Admitting: Nurse Practitioner

## 2021-09-29 VITALS — BP 153/90 | HR 80 | Temp 98.9°F | Resp 16

## 2021-09-29 DIAGNOSIS — M79605 Pain in left leg: Secondary | ICD-10-CM | POA: Diagnosis not present

## 2021-09-29 DIAGNOSIS — S60212A Contusion of left wrist, initial encounter: Secondary | ICD-10-CM

## 2021-09-29 DIAGNOSIS — W19XXXA Unspecified fall, initial encounter: Secondary | ICD-10-CM

## 2021-09-29 DIAGNOSIS — S7012XA Contusion of left thigh, initial encounter: Secondary | ICD-10-CM | POA: Diagnosis not present

## 2021-09-29 DIAGNOSIS — M25532 Pain in left wrist: Secondary | ICD-10-CM | POA: Diagnosis not present

## 2021-09-29 NOTE — Discharge Instructions (Addendum)
The x-rays are negative for a fracture or dislocation.  As discussed, symptoms are consistent with a contusion of the left wrist due to hitting the steps and an acute hematoma of the left thigh. Continue application of ice.  Apply for 20 minutes, remove for 1 hour, then repeat as much as possible.  Recommend doing this is much as possible to help symptoms. May take over-the-counter ibuprofen or Tylenol as needed for pain or discomfort. If symptoms fail to improve over the next 2 to 4 weeks, recommend following up with orthopedics.  You can follow-up with [facility] at [phone] or [facility] at [phone].

## 2021-09-29 NOTE — ED Triage Notes (Signed)
Pt reports pain in left  wrist and left pain x 2 weeks after she fell down the stairs (13 steps). Pt was checked by PCP 2 days after the fell, pt had x-ray in left shoulder. Reports she can not ley over left leg, feels she has a knot in the left leg. Tylenol gives no relief.

## 2021-09-29 NOTE — ED Provider Notes (Signed)
[facility]    CSN: [id number] Arrival date & time: 09/29/21  1642      History   Chief Complaint Chief Complaint  Patient presents with   Appointment    1700 I fell down my stairs about 2 weeks ago. I was severely bruised and thought it would ease up. Although the bruising has subsided, I am still having severe wrist and leg pain. - Entered by patient   Fall        Leg Pain    HPI Elaine Hernandez is a 26 y.o. female.   The history is provided by the patient.   Patient presents for left wrist pain and right upper leg pain that been present for the past 2 weeks.  Patient states that she slid down a flight of 13 wooden steps on her left side with her left arm up by her ear and her wrist hitting each step as she came down.  Patient states that she did follow-up with her PCP Vicente Males x-ray of her shoulder was performed as he thought she may have a fracture clavicle, that x-ray was negative.  She states that she is continue to have pain in the left wrist and the left thigh since the fall. Patient states that she can feel "knot" in her leg.  She states that she did have some bruising there but that has since improved.  She states that she has pain with walking, prolonged sitting, and when she flexes and extends her knee.  She states that she has been using ice to the affected area.  She states that her wrist pain is so severe that she can only pick up something as heavy as her phone.  That pain worsens with inversion and eversion.  She denies falling on an outstretched arm or landing directly on the left wrist.    Past Medical History:  Diagnosis Date   Headache    Hypertension    Panic attacks    PONV (postoperative nausea and vomiting)     Patient Active Problem List   Diagnosis Date Noted   Menorrhagia with regular cycle 04/12/2021   Pregnancy examination or test, negative result 04/12/2021   Routine cervical smear 04/12/2021   Uterine cramping 04/12/2021   Sebaceous  cyst of left axilla 01/22/2019   Chronic hypertension 02/01/2018   Foreign body, hand, superficial     Past Surgical History:  Procedure Laterality Date   CYST EXCISION Left 01/25/2019   Procedure: CYST REMOVAL;  Surgeon: Virl Cagey, MD;  Location: [facility];  Service: General;  Laterality: Left;   FOREIGN BODY REMOVAL Left 09/30/2015   Procedure: FOREIGN BODY REMOVAL ADULT;  Surgeon: Carole Civil, MD;  Location: [facility];  Service: Orthopedics;  Laterality: Left;  LEFT HAND   WISDOM TOOTH EXTRACTION Bilateral     OB History     Gravida  0   Para  0   Term      Preterm      AB      Living         SAB      IAB      Ectopic      Multiple      Live Births               Home Medications    Prior to Admission medications   Medication Sig Start Date End Date Taking? Authorizing Provider  cyclobenzaprine (FLEXERIL) 5 MG tablet Take 1 tablet (5  mg total) by mouth 3 (three) times daily as needed for muscle spasms. Do not drink alcohol or drive while taking this medication.  May cause drowsiness. 06/16/21   Volney American, PA-C  hydrochlorothiazide (HYDRODIURIL) 25 MG tablet Take 25 mg by mouth daily.    [provider]  hydrOXYzine (ATARAX) 25 MG tablet Take 0.5-1 tablets (12.5-25 mg total) by mouth every 8 (eight) hours as needed for itching. 04/20/21   Jaynee Eagles, PA-C  megestrol (MEGACE) 40 MG tablet Take 3 x 5 days then 2 x 5 days then 1 daily til bleeding stops 04/12/21   Estill Dooms, NP  Multiple Vitamin (MULTIVITAMIN WITH MINERALS) TABS tablet Take 2 tablets by mouth daily. Women's One-A-Day     [provider]  naproxen (NAPROSYN) 500 MG tablet Take 1 tablet (500 mg total) by mouth 2 (two) times daily as needed. 06/16/21   Volney American, PA-C  triamcinolone cream (KENALOG) 0.1 % Apply 1 application. topically 2 (two) times daily. 04/20/21   Jaynee Eagles, PA-C  amLODipine (NORVASC) 5 MG tablet Take 1 tablet (5 mg total)  by mouth daily. 03/09/18 01/05/19  Estill Dooms, NP    Family History Family History  Problem Relation Age of Onset   Hypertension Mother    Hypertension Father    Cancer Paternal Grandfather        lung   Cancer Paternal Grandmother        lung   Hypertension Maternal Grandmother    Alcohol abuse Maternal Grandfather     Social History Social History   Tobacco Use   Smoking status: Never   Smokeless tobacco: Never  Vaping Use   Vaping Use: Never used  Substance Use Topics   Alcohol use: Yes    Comment: occasional   Drug use: No     Allergies   Amoxicillin, Cefdinir, Other, and Smz-tmp ds [sulfamethoxazole-trimethoprim]   Review of Systems Review of Systems Per HPI  Physical Exam Triage Vital Signs ED Triage Vitals  Enc Vitals Group     BP 09/29/21 1656 (!) 153/90     Pulse Rate 09/29/21 1656 80     Resp 09/29/21 1656 16     Temp 09/29/21 1656 98.9 F (37.2 C)     Temp Source 09/29/21 1656 Oral     SpO2 09/29/21 1656 99 %     Weight --      Height --      Head Circumference --      Peak Flow --      Pain Score 09/29/21 1655 6     Pain Loc --      Pain Edu? --      Excl. in [place]? --    No data found.  Updated Vital Signs BP (!) 153/90 (BP Location: Right Arm)   Pulse 80   Temp 98.9 F (37.2 C) (Oral)   Resp 16   LMP 09/21/2021 (Exact Date)   SpO2 99%   Visual Acuity Right Eye Distance:   Left Eye Distance:   Bilateral Distance:    Right Eye Near:   Left Eye Near:    Bilateral Near:     Physical Exam Vitals and nursing note reviewed.  Constitutional:      Appearance: Normal appearance.  HENT:     Head: Normocephalic.  Eyes:     Extraocular Movements: Extraocular movements intact.     Conjunctiva/sclera: Conjunctivae normal.     Pupils: Pupils are equal, round, and  reactive to light.  Pulmonary:     Effort: Pulmonary effort is normal.  Musculoskeletal:     Left wrist: Tenderness present. No swelling, deformity, snuff box  tenderness or crepitus. Decreased range of motion. Normal pulse.     Cervical back: Normal range of motion.     Left upper leg: Tenderness (Lateral aspect of left thigh.  Hematoma is present) present. No edema or deformity.     Comments: Tenderness noted to the ulnar and radial aspect of the left wrist.  Skin:    General: Skin is warm and dry.  Neurological:     General: No focal deficit present.     Mental Status: She is alert and oriented to person, place, and time.  Psychiatric:        Mood and Affect: Mood normal.        Behavior: Behavior normal.      UC Treatments / Results  Labs (all labs ordered are listed, but only abnormal results are displayed) Labs Reviewed - No data to display  EKG   Radiology DG Wrist Complete Left  Result Date: 09/29/2021 CLINICAL DATA:  Fall 2 weeks ago.  Left wrist pain. EXAM: LEFT WRIST - COMPLETE 3+ VIEW COMPARISON:  Left hand radiographs 09/30/2015 FINDINGS: Neutral ulnar variance. Joint spaces are preserved. No acute fracture is seen. No dislocation. IMPRESSION: No acute fracture. Electronically Signed   By: Yvonne Kendall M.D.   On: 09/29/2021 17:35   DG Femur Min 2 Views Left  Result Date: 09/29/2021 CLINICAL DATA:  Trauma, recent fall, pain EXAM: LEFT FEMUR 2 VIEWS COMPARISON:  None Available. FINDINGS: There is no evidence of fracture or other focal bone lesions. Soft tissues are unremarkable. IMPRESSION: No fracture or dislocation is seen in left knee. Electronically Signed   By: Elmer Picker M.D.   On: 09/29/2021 17:29    Procedures Procedures (including critical care time)  Medications Ordered in UC Medications - No data to display  Initial Impression / Assessment and Plan / UC Course  I have reviewed the triage vital signs and the nursing notes.  Pertinent labs & imaging results that were available during my care of the patient were reviewed by me and considered in my medical decision making (see chart for  details).  Patient presents with left wrist pain and left femur pain that been present since she fell approximately 2 weeks ago.  On exam, she has limited range of motion to the left wrist with inversion and eversion.  There is no obvious deformity or swelling present.  With regard to the left leg, she has tenderness to the lateral aspect of the left thigh with hematoma present.  X-rays are negative for fracture in the wrist and of the left femur.  Symptoms are consistent with a left wrist sprain and contusion of the left thigh.  Reassured patient that symptoms will continue to improve, but that it may take some time.  Patient was advised to continue use application of ice, and over-the-counter analgesics.  Patient was given the number for orthopedics if her symptoms do not improve over the next 2 to 4 weeks.  Patient was given information for [facility] [place] and for [facility].  Patient verbalizes understanding.  All questions were answered. Final Clinical Impressions(s) / UC Diagnoses   Final diagnoses:  Contusion of left wrist, initial encounter  Contusion of left thigh, initial encounter  Hematoma of left thigh, initial encounter     Discharge Instructions  The x-rays are negative for a fracture or dislocation.  As discussed, symptoms are consistent with a contusion of the left wrist due to hitting the steps and an acute hematoma of the left thigh. Continue application of ice.  Apply for 20 minutes, remove for 1 hour, then repeat as much as possible.  Recommend doing this is much as possible to help symptoms. May take over-the-counter ibuprofen or Tylenol as needed for pain or discomfort. If symptoms fail to improve over the next 2 to 4 weeks, recommend following up with orthopedics.  You can follow-up with [facility] [place] at [phone] or [facility] at [phone].     ED Prescriptions   None    PDMP not reviewed this encounter.   Tish Men,  NP 09/29/21 1740

## 2021-12-13 ENCOUNTER — Ambulatory Visit
Admission: EM | Admit: 2021-12-13 | Discharge: 2021-12-13 | Disposition: A | Discharge status: Home / Self Care | Payer: No Typology Code available for payment source | Attending: Nurse Practitioner | Admitting: Nurse Practitioner

## 2021-12-13 DIAGNOSIS — B349 Viral infection, unspecified: Secondary | ICD-10-CM | POA: Diagnosis not present

## 2021-12-13 DIAGNOSIS — N3000 Acute cystitis without hematuria: Secondary | ICD-10-CM

## 2021-12-13 DIAGNOSIS — Z1152 Encounter for screening for COVID-19: Secondary | ICD-10-CM | POA: Diagnosis present

## 2021-12-13 LAB — POCT URINALYSIS DIP (MANUAL ENTRY)
Bilirubin, UA: NEGATIVE
Glucose, UA: NEGATIVE mg/dL
Ketones, POC UA: NEGATIVE mg/dL
Nitrite, UA: POSITIVE — AB
Protein Ur, POC: 30 mg/dL — AB
Spec Grav, UA: 1.02 (ref 1.010–1.025)
Urobilinogen, UA: 0.2 E.U./dL
pH, UA: 7 (ref 5.0–8.0)

## 2021-12-13 LAB — RESP PANEL BY RT-PCR (FLU A&B, COVID) ARPGX2
Influenza A by PCR: NEGATIVE
Influenza B by PCR: POSITIVE — AB
SARS Coronavirus 2 by RT PCR: NEGATIVE

## 2021-12-13 MED ORDER — NITROFURANTOIN MONOHYD MACRO 100 MG PO CAPS: 100.0000 mg | ORAL_CAPSULE | Freq: Two times a day (BID) | ORAL | 0 refills | Status: DC

## 2021-12-13 MED ORDER — FLUTICASONE PROPIONATE 50 MCG/ACT NA SUSP: 2.0000 | Freq: Every day | NASAL | 0 refills | Status: DC

## 2021-12-13 MED ORDER — PROMETHAZINE-DM 6.25-15 MG/5ML PO SYRP: 5.0000 mL | ORAL_SOLUTION | Freq: Four times a day (QID) | ORAL | 0 refills | Status: DC | PRN

## 2021-12-13 MED ORDER — FLUCONAZOLE 150 MG PO TABS: 150.0000 mg | ORAL_TABLET | Freq: Every day | ORAL | 0 refills | Status: DC

## 2021-12-13 NOTE — ED Provider Notes (Signed)
[facility]    CSN: [id number] Arrival date & time: 12/13/21  0805      History   Chief Complaint Chief Complaint  Patient presents with   Headache   Back Pain   Cough   Facial Pain    HPI Elaine Hernandez is a 26 y.o. female.   The history is provided by the patient.   Patient presents with a 2-day history of bodyaches, chills, nasal congestion, sinus pressure, headache, and cough.  She states that she has not had fever, but states that her temperature did get up to 100.  She denies ear pain, wheezing, shortness of breath, difficulty breathing, or GI symptoms.  She reports that several of her nephews were diagnosed with flu last week.  She has not been vaccinated for COVID or influenza.  She has been taking over-the-counter cough and cold medications for her symptoms.  She also complains of low back pain that started 2 days ago.  She states that she does have a history of a tear in her bladder, and states that when she urinates she has noticed that the back pain presents.  She denies frequency, urgency, dysuria, hesitancy, or hematuria.  She states that she was concerned that she may have a urinary tract infection. Past Medical History:  Diagnosis Date   Headache    Hypertension    Panic attacks    PONV (postoperative nausea and vomiting)     Patient Active Problem List   Diagnosis Date Noted   Menorrhagia with regular cycle 04/12/2021   Pregnancy examination or test, negative result 04/12/2021   Routine cervical smear 04/12/2021   Uterine cramping 04/12/2021   Sebaceous cyst of left axilla 01/22/2019   Chronic hypertension 02/01/2018   Foreign body, hand, superficial     Past Surgical History:  Procedure Laterality Date   CYST EXCISION Left 01/25/2019   Procedure: CYST REMOVAL;  Surgeon: Lucretia Roers, MD;  Location: [facility];  Service: General;  Laterality: Left;   FOREIGN BODY REMOVAL Left 09/30/2015   Procedure: FOREIGN BODY REMOVAL ADULT;  Surgeon:  Vickki Hearing, MD;  Location: [facility];  Service: Orthopedics;  Laterality: Left;  LEFT HAND   WISDOM TOOTH EXTRACTION Bilateral     OB History     Gravida  0   Para  0   Term      Preterm      AB      Living         SAB      IAB      Ectopic      Multiple      Live Births               Home Medications    Prior to Admission medications   Medication Sig Start Date End Date Taking? Authorizing Provider  fluconazole (DIFLUCAN) 150 MG tablet Take 1 tablet (150 mg total) by mouth daily. 12/13/21  Yes Cabela Pacifico-Warren, Sadie Haber, NP  fluticasone (FLONASE) 50 MCG/ACT nasal spray Place 2 sprays into both nostrils daily. 12/13/21  Yes Melanye Hiraldo-Warren, Sadie Haber, NP  hydrochlorothiazide (HYDRODIURIL) 25 MG tablet Take 25 mg by mouth daily.   Yes [provider]  Multiple Vitamin (MULTIVITAMIN WITH MINERALS) TABS tablet Take 2 tablets by mouth daily. Women's One-A-Day    Yes [provider]  nitrofurantoin, macrocrystal-monohydrate, (MACROBID) 100 MG capsule Take 1 capsule (100 mg total) by mouth 2 (two) times daily. 12/13/21  Yes Graysen Depaula-Warren, Sadie Haber, NP  promethazine-dextromethorphan (PROMETHAZINE-DM) 6.25-15 MG/5ML syrup Take 5 mLs by mouth 4 (four) times daily as needed for cough. 12/13/21  Yes Jetson Pickrel-Warren, Sadie Haber, NP  cyclobenzaprine (FLEXERIL) 5 MG tablet Take 1 tablet (5 mg total) by mouth 3 (three) times daily as needed for muscle spasms. Do not drink alcohol or drive while taking this medication.  May cause drowsiness. 06/16/21   Particia Nearing, PA-C  hydrOXYzine (ATARAX) 25 MG tablet Take 0.5-1 tablets (12.5-25 mg total) by mouth every 8 (eight) hours as needed for itching. 04/20/21   Wallis Bamberg, PA-C  megestrol (MEGACE) 40 MG tablet Take 3 x 5 days then 2 x 5 days then 1 daily til bleeding stops 04/12/21   Cyril Mourning A, NP  naproxen (NAPROSYN) 500 MG tablet Take 1 tablet (500 mg total) by mouth 2 (two) times daily as needed. 06/16/21    Particia Nearing, PA-C  triamcinolone cream (KENALOG) 0.1 % Apply 1 application. topically 2 (two) times daily. 04/20/21   Wallis Bamberg, PA-C  amLODipine (NORVASC) 5 MG tablet Take 1 tablet (5 mg total) by mouth daily. 03/09/18 01/05/19  Adline Potter, NP    Family History Family History  Problem Relation Age of Onset   Hypertension Mother    Hypertension Father    Cancer Paternal Grandfather        lung   Cancer Paternal Grandmother        lung   Hypertension Maternal Grandmother    Alcohol abuse Maternal Grandfather     Social History Social History   Tobacco Use   Smoking status: Never   Smokeless tobacco: Never  Vaping Use   Vaping Use: Never used  Substance Use Topics   Alcohol use: Yes    Comment: occasional   Drug use: No     Allergies   Amoxicillin, Cefdinir, Other, and Smz-tmp ds [sulfamethoxazole-trimethoprim]   Review of Systems Review of Systems Per HPI  Physical Exam Triage Vital Signs ED Triage Vitals [12/13/21 0837]  Enc Vitals Group     BP (!) 164/119     Pulse Rate (!) 102     Resp 18     Temp 98.8 F (37.1 C)     Temp Source Oral     SpO2 99 %     Weight      Height      Head Circumference      Peak Flow      Pain Score      Pain Loc      Pain Edu?      Excl. in [place]?    No data found.  Updated Vital Signs BP (!) 150/102   Pulse (!) 102   Temp 98.8 F (37.1 C) (Oral)   Resp 18   LMP 11/18/2021 (Approximate)   SpO2 99%   Visual Acuity Right Eye Distance:   Left Eye Distance:   Bilateral Distance:    Right Eye Near:   Left Eye Near:    Bilateral Near:     Physical Exam Vitals and nursing note reviewed.  Constitutional:      General: She is not in acute distress.    Appearance: She is well-developed.  HENT:     Head: Normocephalic.     Right Ear: Tympanic membrane, ear canal and external ear normal.     Left Ear: Tympanic membrane, ear canal and external ear normal.     Nose: Congestion present. No  rhinorrhea.     Right Turbinates: Enlarged  and swollen.     Left Turbinates: Enlarged and swollen.     Right Sinus: No maxillary sinus tenderness or frontal sinus tenderness.     Left Sinus: No maxillary sinus tenderness or frontal sinus tenderness.     Mouth/Throat:     Lips: Pink.     Mouth: Mucous membranes are moist.     Pharynx: Oropharynx is clear. Uvula midline. Posterior oropharyngeal erythema present. No pharyngeal swelling or oropharyngeal exudate.  Eyes:     Extraocular Movements:     Right eye: Normal extraocular motion.     Left eye: Normal extraocular motion.     Pupils: Pupils are equal, round, and reactive to light.  Cardiovascular:     Rate and Rhythm: Tachycardia present.     Heart sounds: Normal heart sounds.  Pulmonary:     Effort: Pulmonary effort is normal. No respiratory distress.     Breath sounds: Normal breath sounds. No stridor. No wheezing, rhonchi or rales.  Abdominal:     General: Bowel sounds are normal.     Palpations: Abdomen is soft.     Tenderness: There is no abdominal tenderness. There is no right CVA tenderness or left CVA tenderness.  Musculoskeletal:     Cervical back: Normal range of motion.  Lymphadenopathy:     Cervical: No cervical adenopathy.  Skin:    General: Skin is warm and dry.  Neurological:     General: No focal deficit present.     Mental Status: She is alert and oriented to person, place, and time.  Psychiatric:        Mood and Affect: Mood normal.        Speech: Speech normal.        Behavior: Behavior normal.      UC Treatments / Results  Labs (all labs ordered are listed, but only abnormal results are displayed) Labs Reviewed  POCT URINALYSIS DIP (MANUAL ENTRY) - Abnormal; Notable for the following components:      Result Value   Clarity, UA hazy (*)    Blood, UA trace-intact (*)    Protein Ur, POC =30 (*)    Nitrite, UA Positive (*)    Leukocytes, UA Small (1+) (*)    All other components within normal  limits  URINE CULTURE  RESP PANEL BY RT-PCR (FLU A&B, COVID) [id number]    EKG   Radiology No results found.  Procedures Procedures (including critical care time)  Medications Ordered in UC Medications - No data to display  Initial Impression / Assessment and Plan / UC Course  I have reviewed the triage vital signs and the nursing notes.  Pertinent labs & imaging results that were available during my care of the patient were reviewed by me and considered in my medical decision making (see chart for details).  Patient presents for complaints of upper respiratory symptoms and low back pain.  Patient's vital signs show she is hypertensive and mildly tachycardic, she is in no acute distress, blood pressure was rechecked prior to her discharge which was 150/102.  Patient is asymptomatic at this time.  Patient will follow-up with her primary care physician for further evaluation.  With regard to her viral symptoms, patient was prescribed Promethazine DM and fluticasone.  And for her urinary tract infection, patient was prescribed Macrobid.  Supportive care recommendations were provided to the patient to include use of Tylenol to help with pain or discomfort.  Increasing fluids and use of a humidifier to help with cough.  For the acute cystitis, patient was advised to void every 2 hours, to avoid 15 to 20 minutes after sexual intercourse, and to increase her fluid intake.  Patient was given strict ER precautions with regard to her acute cystitis.  COVID/flu test and urine culture are pending.  Supportive care recommendations were provided to the patient.  Patient verbalizes understanding.  All questions were answered.  Patient is stable for discharge.  Work note was provided. Final Clinical Impressions(s) / UC Diagnoses   Final diagnoses:  Viral illness  Acute cystitis without hematuria  Encounter for screening for COVID-19     Discharge Instructions      COVID/Flu test is pending. If your  results are positive, you will be contacted. As discussed, you decline antiviral therapy. The urinalysis shows that you have a urinary tract infection. Take medication as prescribed. Increase fluids and allow for plenty of rest.  Try to drink at least good 8-10 8 ounce glasses of water while symptoms persist. Warm salt water gargles 3-4 times daily while throat pain persist or if it develops. Also recommend sleeping elevated on pillows while symptoms persist, and using a humidifier.  For your urinary tract infection: Take medication as prescribed. May take over-the-counter Tylenol as needed for pain or discomfort. Try to drink at least 8-10 8 ounce glasses of water while symptoms persist. Try to void at least every 2 hours. If you are sexually active, please void at least 15 to 20 minutes after sexual intercourse. If you develop worsening EVAR, chills, worsening low back pain, or symptoms are not improving, please go to the emergency department immediately for further evaluation.  Please follow-up with your primary care physician this week to recheck your blood pressure.  If you develop chest pain, shortness of breath, dizziness, lightheadedness, or blurred vision, please go to the emergency department immediately.  Follow-up as needed.      ED Prescriptions     Medication Sig Dispense Auth. Provider   nitrofurantoin, macrocrystal-monohydrate, (MACROBID) 100 MG capsule Take 1 capsule (100 mg total) by mouth 2 (two) times daily. 10 capsule Nochum Fenter-Warren, Sadie Haber, NP   promethazine-dextromethorphan (PROMETHAZINE-DM) 6.25-15 MG/5ML syrup Take 5 mLs by mouth 4 (four) times daily as needed for cough. 118 mL Priscilla Finklea-Warren, Sadie Haber, NP   fluticasone (FLONASE) 50 MCG/ACT nasal spray Place 2 sprays into both nostrils daily. 16 g Whitnee Orzel-Warren, Sadie Haber, NP   fluconazole (DIFLUCAN) 150 MG tablet Take 1 tablet (150 mg total) by mouth daily. 1 tablet Proctor Carriker-Warren, Sadie Haber, NP      PDMP  not reviewed this encounter.   Abran Cantor, NP 12/13/21 [phone]

## 2021-12-13 NOTE — ED Triage Notes (Signed)
Headache, lower back pain that started Friday. Now having sinus pressure, cough, sore throat, nephews that live with her just had the flu and thinks it could be that. Taking Nyquil, Theraflu OTC.

## 2021-12-13 NOTE — Discharge Instructions (Addendum)
COVID/Flu test is pending. If your results are positive, you will be contacted. As discussed, you decline antiviral therapy. The urinalysis shows that you have a urinary tract infection. Take medication as prescribed. Increase fluids and allow for plenty of rest.  Try to drink at least good 8-10 8 ounce glasses of water while symptoms persist. Warm salt water gargles 3-4 times daily while throat pain persist or if it develops. Also recommend sleeping elevated on pillows while symptoms persist, and using a humidifier.  For your urinary tract infection: Take medication as prescribed. May take over-the-counter Tylenol as needed for pain or discomfort. Try to drink at least 8-10 8 ounce glasses of water while symptoms persist. Try to void at least every 2 hours. If you are sexually active, please void at least 15 to 20 minutes after sexual intercourse. If you develop worsening EVAR, chills, worsening low back pain, or symptoms are not improving, please go to the emergency department immediately for further evaluation.  Please follow-up with your primary care physician this week to recheck your blood pressure.  If you develop chest pain, shortness of breath, dizziness, lightheadedness, or blurred vision, please go to the emergency department immediately.  Follow-up as needed.

## 2021-12-15 LAB — URINE CULTURE

## 2022-01-06 ENCOUNTER — Ambulatory Visit
Admission: EM | Admit: 2022-01-06 | Discharge: 2022-01-06 | Disposition: A | Discharge status: Home / Self Care | Payer: No Typology Code available for payment source | Attending: Nurse Practitioner | Admitting: Nurse Practitioner

## 2022-01-06 ENCOUNTER — Encounter: Payer: Self-pay | Admitting: Emergency Medicine

## 2022-01-06 DIAGNOSIS — J029 Acute pharyngitis, unspecified: Secondary | ICD-10-CM | POA: Diagnosis not present

## 2022-01-06 DIAGNOSIS — B349 Viral infection, unspecified: Secondary | ICD-10-CM | POA: Diagnosis present

## 2022-01-06 LAB — POCT RAPID STREP A (OFFICE): Rapid Strep A Screen: NEGATIVE

## 2022-01-06 MED ORDER — OSELTAMIVIR PHOSPHATE 75 MG PO CAPS: 75.0000 mg | ORAL_CAPSULE | Freq: Two times a day (BID) | ORAL | 0 refills | Status: DC

## 2022-01-06 MED ORDER — IBUPROFEN 800 MG PO TABS: 800.0000 mg | ORAL_TABLET | Freq: Three times a day (TID) | ORAL | 0 refills | Status: DC

## 2022-01-06 NOTE — ED Provider Notes (Signed)
[facility]    CSN: [id number] Arrival date & time: 01/06/22  1705      History   Chief Complaint Chief Complaint  Patient presents with   Emesis   Fever   Sore Throat    HPI Elaine Hernandez is a 26 y.o. female.   The history is provided by the patient.   Patient presents for complaints of fatigue, fever, back pain, chills, sweats, headaches, and sore throat.  Patient states several days before her symptoms started, she experienced vomiting and diarrhea.  She states those symptoms have since improved.  She states that she does not have cough, or abdominal pain at this time.  Patient reports that she was diagnosed with influenza earlier this month.  She did not start Tamiflu.  Patient denies any known sick contacts.  Patient reports she has been taking Aleve and DayQuil for her symptoms.    Past Medical History:  Diagnosis Date   Headache    Hypertension    Panic attacks    PONV (postoperative nausea and vomiting)     Patient Active Problem List   Diagnosis Date Noted   Menorrhagia with regular cycle 04/12/2021   Pregnancy examination or test, negative result 04/12/2021   Routine cervical smear 04/12/2021   Uterine cramping 04/12/2021   Sebaceous cyst of left axilla 01/22/2019   Chronic hypertension 02/01/2018   Foreign body, hand, superficial     Past Surgical History:  Procedure Laterality Date   CYST EXCISION Left 01/25/2019   Procedure: CYST REMOVAL;  Surgeon: Lucretia Roers, MD;  Location: [facility];  Service: General;  Laterality: Left;   FOREIGN BODY REMOVAL Left 09/30/2015   Procedure: FOREIGN BODY REMOVAL ADULT;  Surgeon: Vickki Hearing, MD;  Location: [facility];  Service: Orthopedics;  Laterality: Left;  LEFT HAND   WISDOM TOOTH EXTRACTION Bilateral     OB History     Gravida  0   Para  0   Term      Preterm      AB      Living         SAB      IAB      Ectopic      Multiple      Live Births               Home  Medications    Prior to Admission medications   Medication Sig Start Date End Date Taking? Authorizing Provider  ibuprofen (ADVIL) 800 MG tablet Take 1 tablet (800 mg total) by mouth 3 (three) times daily. 01/06/22  Yes Baley Shands-Warren, Sadie Haber, NP  oseltamivir (TAMIFLU) 75 MG capsule Take 1 capsule (75 mg total) by mouth every 12 (twelve) hours. 01/06/22  Yes Viktoria Gruetzmacher-Warren, Sadie Haber, NP  cyclobenzaprine (FLEXERIL) 5 MG tablet Take 1 tablet (5 mg total) by mouth 3 (three) times daily as needed for muscle spasms. Do not drink alcohol or drive while taking this medication.  May cause drowsiness. 06/16/21   Particia Nearing, PA-C  fluconazole (DIFLUCAN) 150 MG tablet Take 1 tablet (150 mg total) by mouth daily. 12/13/21   Myna Freimark-Warren, Sadie Haber, NP  fluticasone (FLONASE) 50 MCG/ACT nasal spray Place 2 sprays into both nostrils daily. 12/13/21   Samanda Buske-Warren, Sadie Haber, NP  hydrochlorothiazide (HYDRODIURIL) 25 MG tablet Take 25 mg by mouth daily.    [provider]  hydrOXYzine (ATARAX) 25 MG tablet Take 0.5-1 tablets (12.5-25 mg total) by mouth every 8 (eight) hours  as needed for itching. 04/20/21   Jaynee Eagles, PA-C  megestrol (MEGACE) 40 MG tablet Take 3 x 5 days then 2 x 5 days then 1 daily til bleeding stops 04/12/21   Estill Dooms, NP  Multiple Vitamin (MULTIVITAMIN WITH MINERALS) TABS tablet Take 2 tablets by mouth daily. Women's One-A-Day     [provider]  naproxen (NAPROSYN) 500 MG tablet Take 1 tablet (500 mg total) by mouth 2 (two) times daily as needed. 06/16/21   Volney American, PA-C  nitrofurantoin, macrocrystal-monohydrate, (MACROBID) 100 MG capsule Take 1 capsule (100 mg total) by mouth 2 (two) times daily. 12/13/21   Jacolyn Joaquin-Warren, Alda Lea, NP  promethazine-dextromethorphan (PROMETHAZINE-DM) 6.25-15 MG/5ML syrup Take 5 mLs by mouth 4 (four) times daily as needed for cough. 12/13/21   Shawonda Kerce-Warren, Alda Lea, NP  triamcinolone cream (KENALOG) 0.1 %  Apply 1 application. topically 2 (two) times daily. 04/20/21   Jaynee Eagles, PA-C  amLODipine (NORVASC) 5 MG tablet Take 1 tablet (5 mg total) by mouth daily. 03/09/18 01/05/19  Estill Dooms, NP    Family History Family History  Problem Relation Age of Onset   Hypertension Mother    Hypertension Father    Cancer Paternal Grandfather        lung   Cancer Paternal Grandmother        lung   Hypertension Maternal Grandmother    Alcohol abuse Maternal Grandfather     Social History Social History   Tobacco Use   Smoking status: Never   Smokeless tobacco: Never  Vaping Use   Vaping Use: Never used  Substance Use Topics   Alcohol use: Yes    Comment: occasional   Drug use: No     Allergies   Amoxicillin, Cefdinir, Other, and Smz-tmp ds [sulfamethoxazole-trimethoprim]   Review of Systems Review of Systems Per HPI  Physical Exam Triage Vital Signs ED Triage Vitals  Enc Vitals Group     BP 01/06/22 1825 (!) 152/99     Pulse Rate 01/06/22 1825 99     Resp 01/06/22 1825 18     Temp 01/06/22 1825 98.4 F (36.9 C)     Temp Source 01/06/22 1825 Oral     SpO2 01/06/22 1825 98 %     Weight --      Height --      Head Circumference --      Peak Flow --      Pain Score 01/06/22 1828 7     Pain Loc --      Pain Edu? --      Excl. in [place]? --    No data found.  Updated Vital Signs BP (!) 152/99 (BP Location: Right Arm)   Pulse 99   Temp 98.4 F (36.9 C) (Oral)   Resp 18   LMP 12/18/2021 (Approximate)   SpO2 98%   Visual Acuity Right Eye Distance:   Left Eye Distance:   Bilateral Distance:    Right Eye Near:   Left Eye Near:    Bilateral Near:     Physical Exam Vitals and nursing note reviewed.  Constitutional:      General: She is not in acute distress.    Appearance: She is well-developed.  HENT:     Head: Normocephalic.     Right Ear: Tympanic membrane and ear canal normal.     Left Ear: Tympanic membrane and ear canal normal.     Nose: No  congestion.  Mouth/Throat:     Mouth: Mucous membranes are moist.     Pharynx: Posterior oropharyngeal erythema present. No pharyngeal swelling.     Tonsils: No tonsillar exudate.  Cardiovascular:     Rate and Rhythm: Regular rhythm.     Heart sounds: Normal heart sounds.  Pulmonary:     Effort: Pulmonary effort is normal. No respiratory distress.     Breath sounds: Normal breath sounds. No stridor. No wheezing, rhonchi or rales.  Abdominal:     General: Bowel sounds are normal.     Palpations: Abdomen is soft.  Musculoskeletal:     Cervical back: Normal range of motion.  Lymphadenopathy:     Cervical: No cervical adenopathy.  Skin:    General: Skin is warm and dry.  Neurological:     General: No focal deficit present.     Mental Status: She is alert and oriented to person, place, and time.  Psychiatric:        Mood and Affect: Mood normal.        Behavior: Behavior normal.      UC Treatments / Results  Labs (all labs ordered are listed, but only abnormal results are displayed) Labs Reviewed  CULTURE, GROUP A STREP [facility])  POCT RAPID STREP A (OFFICE)    EKG   Radiology No results found.  Procedures Procedures (including critical care time)  Medications Ordered in UC Medications - No data to display  Initial Impression / Assessment and Plan / UC Course  I have reviewed the triage vital signs and the nursing notes.  Pertinent labs & imaging results that were available during my care of the patient were reviewed by me and considered in my medical decision making (see chart for details).  The patient is well-appearing, she is in no acute distress, she is hypertensive, but vital signs are otherwise stable.  Rapid strep test was negative, throat culture is pending.  Given the patient's exam and symptoms, symptoms appear to be consistent with a viral illness.  Difficult to ascertain if this is influenza due to limited availability for testing.  Will treat patient  with Tamiflu 75 mg for her symptoms however.  Patient was also prescribed ibuprofen 800 mg grams to help with her throat pain and discomfort.  Supportive care recommendations were provided to the patient to include warm salt water gargles, increasing fluids, and allowing for plenty of rest.  Discussed viral etiology with the patient and when follow-up may be indicated.  Patient verbalizes understanding.  All questions were answered.  Patient is stable for discharge.  Work note was provided.   Final Clinical Impressions(s) / UC Diagnoses   Final diagnoses:  Viral illness     Discharge Instructions      Take medication as prescribed. Increase fluids and allow for plenty of rest.  Try to drink at least 8-10 8 ounce glasses of water while symptoms persist. Warm salt water gargles 3-4 times daily for throat pain or discomfort. Recommend sleeping elevated on pillows and using a humidifier in your bedroom at nighttime during sleep while cough symptoms persist. Recommend a brat diet until nausea improves.  This includes bananas, rice, applesauce, and toast. Recommend remaining isolated until you have been fever free for at least 24 hours with no medication. Please be advised that a viral illness can last anywhere from 7 to 14 days.  If symptoms suddenly worsen, or extend beyond that time, please follow-up with your primary care physician for further evaluation. Follow-up as needed.  ED Prescriptions     Medication Sig Dispense Auth. Provider   ibuprofen (ADVIL) 800 MG tablet Take 1 tablet (800 mg total) by mouth 3 (three) times daily. 21 tablet Ysenia Filice-Warren, Alda Lea, NP   oseltamivir (TAMIFLU) 75 MG capsule Take 1 capsule (75 mg total) by mouth every 12 (twelve) hours. 10 capsule Lineth Thielke-Warren, Alda Lea, NP      PDMP not reviewed this encounter.   Tish Men, NP 01/06/22 1905

## 2022-01-06 NOTE — Discharge Instructions (Addendum)
Take medication as prescribed. Increase fluids and allow for plenty of rest.  Try to drink at least 8-10 8 ounce glasses of water while symptoms persist. Warm salt water gargles 3-4 times daily for throat pain or discomfort. Recommend sleeping elevated on pillows and using a humidifier in your bedroom at nighttime during sleep while cough symptoms persist. Recommend a brat diet until nausea improves.  This includes bananas, rice, applesauce, and toast. Recommend remaining isolated until you have been fever free for at least 24 hours with no medication. Please be advised that a viral illness can last anywhere from 7 to 14 days.  If symptoms suddenly worsen, or extend beyond that time, please follow-up with your primary care physician for further evaluation. Follow-up as needed. 

## 2022-01-06 NOTE — ED Triage Notes (Signed)
Vomiting on christmas eve night.  Diarrhea on christmas day.  Fatigue, back pain, sweats, headaches.  Fever yesterday, sore throat.  Has been taking aleve and day quil for symptoms.

## 2022-01-08 LAB — CULTURE, GROUP A STREP (THRC)

## 2022-03-10 ENCOUNTER — Encounter: Payer: Self-pay | Admitting: Radiology

## 2022-04-06 ENCOUNTER — Ambulatory Visit
Admission: EM | Admit: 2022-04-06 | Discharge: 2022-04-06 | Disposition: A | Discharge status: Home / Self Care | Payer: 59 | Attending: Family Medicine | Admitting: Family Medicine

## 2022-04-06 ENCOUNTER — Encounter: Payer: Self-pay | Admitting: Emergency Medicine

## 2022-04-06 DIAGNOSIS — N390 Urinary tract infection, site not specified: Secondary | ICD-10-CM | POA: Insufficient documentation

## 2022-04-06 LAB — POCT URINALYSIS DIP (MANUAL ENTRY)
Glucose, UA: NEGATIVE mg/dL
Ketones, POC UA: NEGATIVE mg/dL
Nitrite, UA: NEGATIVE
Protein Ur, POC: 30 mg/dL — AB
Spec Grav, UA: 1.02 (ref 1.010–1.025)
Urobilinogen, UA: 2 E.U./dL — AB
pH, UA: 7 (ref 5.0–8.0)

## 2022-04-06 MED ORDER — NITROFURANTOIN MONOHYD MACRO 100 MG PO CAPS: 100.0000 mg | ORAL_CAPSULE | Freq: Two times a day (BID) | ORAL | 0 refills | Status: DC

## 2022-04-06 MED ORDER — FLUCONAZOLE 150 MG PO TABS: 150.0000 mg | ORAL_TABLET | ORAL | 0 refills | Status: DC

## 2022-04-06 MED ORDER — PHENAZOPYRIDINE HCL 200 MG PO TABS: 200.0000 mg | ORAL_TABLET | Freq: Three times a day (TID) | ORAL | 0 refills | Status: DC | PRN

## 2022-04-06 NOTE — ED Provider Notes (Signed)
[facility]    CSN: [id number] Arrival date & time: 04/06/22  0802      History   Chief Complaint No chief complaint on file.   HPI Elaine Hernandez is a 27 y.o. female.   Patient presenting today with 3-day history of lower abdominal pressure, dysuria, urinary frequency and urgency.  Denies fever, chills, nausea, vomiting, vaginal discharge, hematuria, flank pain.  So far not trying anything over-the-counter for symptoms.  No concern for STIs or pregnancy today.  LMP 03/09/2022.      Past Medical History:  Diagnosis Date   Headache    Hypertension    Panic attacks    PONV (postoperative nausea and vomiting)     Patient Active Problem List   Diagnosis Date Noted   Menorrhagia with regular cycle 04/12/2021   Pregnancy examination or test, negative result 04/12/2021   Routine cervical smear 04/12/2021   Uterine cramping 04/12/2021   Sebaceous cyst of left axilla 01/22/2019   Chronic hypertension 02/01/2018   Foreign body, hand, superficial     Past Surgical History:  Procedure Laterality Date   CYST EXCISION Left 01/25/2019   Procedure: CYST REMOVAL;  Surgeon: Virl Cagey, MD;  Location: [facility];  Service: General;  Laterality: Left;   FOREIGN BODY REMOVAL Left 09/30/2015   Procedure: FOREIGN BODY REMOVAL ADULT;  Surgeon: Carole Civil, MD;  Location: [facility];  Service: Orthopedics;  Laterality: Left;  LEFT HAND   WISDOM TOOTH EXTRACTION Bilateral     OB History     Gravida  0   Para  0   Term      Preterm      AB      Living         SAB      IAB      Ectopic      Multiple      Live Births               Home Medications    Prior to Admission medications   Medication Sig Start Date End Date Taking? Authorizing Provider  nitrofurantoin, macrocrystal-monohydrate, (MACROBID) 100 MG capsule Take 1 capsule (100 mg total) by mouth 2 (two) times daily. 04/06/22  Yes Volney American, PA-C  phenazopyridine (PYRIDIUM)  200 MG tablet Take 1 tablet (200 mg total) by mouth 3 (three) times daily as needed for pain. 04/06/22  Yes Volney American, PA-C  cyclobenzaprine (FLEXERIL) 5 MG tablet Take 1 tablet (5 mg total) by mouth 3 (three) times daily as needed for muscle spasms. Do not drink alcohol or drive while taking this medication.  May cause drowsiness. 06/16/21   Volney American, PA-C  fluconazole (DIFLUCAN) 150 MG tablet Take 1 tablet (150 mg total) by mouth once a week. 04/06/22   Volney American, PA-C  fluticasone [facility]) 50 MCG/ACT nasal spray Place 2 sprays into both nostrils daily. 12/13/21   Leath-Warren, Alda Lea, NP  hydrochlorothiazide (HYDRODIURIL) 25 MG tablet Take 25 mg by mouth daily.    [provider]  hydrOXYzine (ATARAX) 25 MG tablet Take 0.5-1 tablets (12.5-25 mg total) by mouth every 8 (eight) hours as needed for itching. 04/20/21   Jaynee Eagles, PA-C  ibuprofen (ADVIL) 800 MG tablet Take 1 tablet (800 mg total) by mouth 3 (three) times daily. 01/06/22   Leath-Warren, Alda Lea, NP  megestrol (MEGACE) 40 MG tablet Take 3 x 5 days then 2 x 5 days then 1 daily til bleeding  stops 04/12/21   Estill Dooms, NP  Multiple Vitamin (MULTIVITAMIN WITH MINERALS) TABS tablet Take 2 tablets by mouth daily. Women's One-A-Day     [provider]  naproxen (NAPROSYN) 500 MG tablet Take 1 tablet (500 mg total) by mouth 2 (two) times daily as needed. 06/16/21   Volney American, PA-C  nitrofurantoin, macrocrystal-monohydrate, (MACROBID) 100 MG capsule Take 1 capsule (100 mg total) by mouth 2 (two) times daily. 12/13/21   Leath-Warren, Alda Lea, NP  oseltamivir (TAMIFLU) 75 MG capsule Take 1 capsule (75 mg total) by mouth every 12 (twelve) hours. 01/06/22   Leath-Warren, Alda Lea, NP  promethazine-dextromethorphan (PROMETHAZINE-DM) 6.25-15 MG/5ML syrup Take 5 mLs by mouth 4 (four) times daily as needed for cough. 12/13/21   Leath-Warren, Alda Lea, NP  triamcinolone  cream (KENALOG) 0.1 % Apply 1 application. topically 2 (two) times daily. 04/20/21   Jaynee Eagles, PA-C  amLODipine (NORVASC) 5 MG tablet Take 1 tablet (5 mg total) by mouth daily. 03/09/18 01/05/19  Estill Dooms, NP    Family History Family History  Problem Relation Age of Onset   Hypertension Mother    Hypertension Father    Cancer Paternal Grandfather        lung   Cancer Paternal Grandmother        lung   Hypertension Maternal Grandmother    Alcohol abuse Maternal Grandfather     Social History Social History   Tobacco Use   Smoking status: Never   Smokeless tobacco: Never  Vaping Use   Vaping Use: Never used  Substance Use Topics   Alcohol use: Yes    Comment: occasional   Drug use: No     Allergies   Amoxicillin, Cefdinir, Other, and Smz-tmp ds [sulfamethoxazole-trimethoprim]   Review of Systems Review of Systems PER HPI  Physical Exam Triage Vital Signs ED Triage Vitals  Enc Vitals Group     BP 04/06/22 0808 (!) 154/103     Pulse Rate 04/06/22 0808 95     Resp 04/06/22 0808 18     Temp 04/06/22 0808 98.9 F (37.2 C)     Temp Source 04/06/22 0808 Oral     SpO2 04/06/22 0808 98 %     Weight --      Height --      Head Circumference --      Peak Flow --      Pain Score 04/06/22 0810 4     Pain Loc --      Pain Edu? --      Excl. in [place]? --    No data found.  Updated Vital Signs BP (!) 154/103 (BP Location: Right Arm)   Pulse 95   Temp 98.9 F (37.2 C) (Oral)   Resp 18   LMP 03/09/2022 (Exact Date)   SpO2 98%   Visual Acuity Right Eye Distance:   Left Eye Distance:   Bilateral Distance:    Right Eye Near:   Left Eye Near:    Bilateral Near:     Physical Exam Vitals and nursing note reviewed.  Constitutional:      Appearance: Normal appearance. She is not ill-appearing.  HENT:     Head: Atraumatic.     Mouth/Throat:     Mouth: Mucous membranes are moist.     Pharynx: Oropharynx is clear.  Eyes:     Extraocular Movements:  Extraocular movements intact.     Conjunctiva/sclera: Conjunctivae normal.  Cardiovascular:     Rate  and Rhythm: Normal rate and regular rhythm.     Heart sounds: Normal heart sounds.  Pulmonary:     Effort: Pulmonary effort is normal.     Breath sounds: Normal breath sounds.  Abdominal:     General: Bowel sounds are normal. There is no distension.     Palpations: Abdomen is soft.     Tenderness: There is no abdominal tenderness. There is no right CVA tenderness, left CVA tenderness or guarding.  Musculoskeletal:        General: Normal range of motion.     Cervical back: Normal range of motion and neck supple.  Skin:    General: Skin is warm and dry.  Neurological:     Mental Status: She is alert and oriented to person, place, and time.  Psychiatric:        Mood and Affect: Mood normal.        Thought Content: Thought content normal.        Judgment: Judgment normal.      UC Treatments / Results  Labs (all labs ordered are listed, but only abnormal results are displayed) Labs Reviewed  POCT URINALYSIS DIP (MANUAL ENTRY) - Abnormal; Notable for the following components:      Result Value   Bilirubin, UA small (*)    Blood, UA small (*)    Protein Ur, POC =30 (*)    Urobilinogen, UA 2.0 (*)    Leukocytes, UA Large (3+) (*)    All other components within normal limits  URINE CULTURE    EKG   Radiology No results found.  Procedures Procedures (including critical care time)  Medications Ordered in UC Medications - No data to display  Initial Impression / Assessment and Plan / UC Course  I have reviewed the triage vital signs and the nursing notes.  Pertinent labs & imaging results that were available during my care of the patient were reviewed by me and considered in my medical decision making (see chart for details).     Urinalysis with evidence of a urinary tract infection.  Treat with Macrobid, Pyridium as needed and fluids while awaiting urine culture and  adjust if needed.  Return for worsening symptoms.  Final Clinical Impressions(s) / UC Diagnoses   Final diagnoses:  Acute lower UTI   Discharge Instructions   None    ED Prescriptions     Medication Sig Dispense Auth. Provider   phenazopyridine (PYRIDIUM) 200 MG tablet Take 1 tablet (200 mg total) by mouth 3 (three) times daily as needed for pain. 9 tablet Volney American, PA-C   nitrofurantoin, macrocrystal-monohydrate, (MACROBID) 100 MG capsule Take 1 capsule (100 mg total) by mouth 2 (two) times daily. 10 capsule Volney American, PA-C   fluconazole (DIFLUCAN) 150 MG tablet Take 1 tablet (150 mg total) by mouth once a week. 2 tablet Volney American, [place]      PDMP not reviewed this encounter.   Volney American, [place] 04/06/22 1433

## 2022-04-06 NOTE — ED Triage Notes (Signed)
Lower back and lower stomach pain, states pain on urination x 3 days.  States symptoms started after having rough sex.  Frequent urination with minimal urine

## 2022-04-07 LAB — URINE CULTURE: Culture: <10000 — AB

## 2022-06-22 ENCOUNTER — Emergency Department (HOSPITAL_COMMUNITY): Admission: EM | Admit: 2022-06-22 | Discharge: 2022-06-22 | Discharge status: Absent without leave | Payer: 59

## 2022-06-23 ENCOUNTER — Ambulatory Visit: Payer: Self-pay

## 2022-06-24 ENCOUNTER — Encounter: Payer: Self-pay | Admitting: Podiatrist

## 2022-06-24 ENCOUNTER — Ambulatory Visit (INDEPENDENT_AMBULATORY_CARE_PROVIDER_SITE_OTHER): Payer: 59 | Admitting: Podiatrist

## 2022-06-24 VITALS — BP 151/98 | HR 74

## 2022-06-24 DIAGNOSIS — S91209A Unspecified open wound of unspecified toe(s) with damage to nail, initial encounter: Secondary | ICD-10-CM

## 2022-06-24 DIAGNOSIS — S99922A Unspecified injury of left foot, initial encounter: Secondary | ICD-10-CM | POA: Diagnosis not present

## 2022-06-24 MED ORDER — MUPIROCIN 2 % EX OINT: 1.0000 | TOPICAL_OINTMENT | Freq: Two times a day (BID) | CUTANEOUS | 2 refills | Status: DC

## 2022-06-24 NOTE — Progress Notes (Unsigned)
Chief Complaint  Patient presents with   Nail Problem    "My nephew and I were playing and he hit my nail.  It is hanging on." N - nail hanging L - hallux left D - Wednesday night O - suddenly C - sore, tender, sharp pain, loose A - walking, shoes T - saw my work Engineer, civil (consulting) and she cleaned / wrapped it, ER, primary care doctor, Ibuprofen     HPI: Patient is 27 y.o. female who presents today for trauma of left hallux nail.  The nail is still hanging on and is painful.  The nail has acrylic on it and came off almost totally.  She relates this occurred this past Wednesday night.  She was seen by her friend who is a Engineer, civil (consulting) at work who cleansed the area and wrapped it-  she was recommended to come here for further treatment.    Patient Active Problem List   Diagnosis Date Noted   Menorrhagia with regular cycle 04/12/2021   Pregnancy examination or test, negative result 04/12/2021   Routine cervical smear 04/12/2021   Uterine cramping 04/12/2021   Sebaceous cyst of left axilla 01/22/2019   Chronic hypertension 02/01/2018   Foreign body, hand, superficial     Current Outpatient Medications on File Prior to Visit  Medication Sig Dispense Refill   cyclobenzaprine (FLEXERIL) 5 MG tablet Take 1 tablet (5 mg total) by mouth 3 (three) times daily as needed for muscle spasms. Do not drink alcohol or drive while taking this medication.  May cause drowsiness. 15 tablet 0   fluconazole (DIFLUCAN) 150 MG tablet Take 1 tablet (150 mg total) by mouth once a week. 2 tablet 0   fluticasone (FLONASE) 50 MCG/ACT nasal spray Place 2 sprays into both nostrils daily. 16 g 0   hydrochlorothiazide (HYDRODIURIL) 25 MG tablet Take 25 mg by mouth daily.     hydrOXYzine (ATARAX) 25 MG tablet Take 0.5-1 tablets (12.5-25 mg total) by mouth every 8 (eight) hours as needed for itching. 30 tablet 0   ibuprofen (ADVIL) 800 MG tablet Take 1 tablet (800 mg total) by mouth 3 (three) times daily. 21 tablet 0   megestrol (MEGACE)  40 MG tablet Take 3 x 5 days then 2 x 5 days then 1 daily til bleeding stops 45 tablet 0   Multiple Vitamin (MULTIVITAMIN WITH MINERALS) TABS tablet Take 2 tablets by mouth daily. Women's One-A-Day      naproxen (NAPROSYN) 500 MG tablet Take 1 tablet (500 mg total) by mouth 2 (two) times daily as needed. 30 tablet 0   nitrofurantoin, macrocrystal-monohydrate, (MACROBID) 100 MG capsule Take 1 capsule (100 mg total) by mouth 2 (two) times daily. 10 capsule 0   nitrofurantoin, macrocrystal-monohydrate, (MACROBID) 100 MG capsule Take 1 capsule (100 mg total) by mouth 2 (two) times daily. 10 capsule 0   oseltamivir (TAMIFLU) 75 MG capsule Take 1 capsule (75 mg total) by mouth every 12 (twelve) hours. 10 capsule 0   phenazopyridine (PYRIDIUM) 200 MG tablet Take 1 tablet (200 mg total) by mouth 3 (three) times daily as needed for pain. 9 tablet 0   promethazine-dextromethorphan (PROMETHAZINE-DM) 6.25-15 MG/5ML syrup Take 5 mLs by mouth 4 (four) times daily as needed for cough. 118 mL 0   triamcinolone cream (KENALOG) 0.1 % Apply 1 application. topically 2 (two) times daily. 30 g 0   [DISCONTINUED] amLODipine (NORVASC) 5 MG tablet Take 1 tablet (5 mg total) by mouth daily. 30 tablet 6   No current  facility-administered medications on file prior to visit.    Allergies  Allergen Reactions   Amoxicillin Hives    Did it involve swelling of the face/tongue/throat, SOB, or low BP? No Did it involve sudden or severe rash/hives, skin peeling, or any reaction on the inside of your mouth or nose? No Did you need to seek medical attention at a hospital or doctor's office? Yes When did it last happen? 2011       If all above answers are "NO", may proceed with cephalosporin use.    Cefdinir Hives    Possible Patient is unsure   Other Other (See Comments)    Citrus fruits. Tongue and throat burning.   Smz-Tmp Ds [Sulfamethoxazole-Trimethoprim] Hives    Bactrim     Review of Systems No fevers, chills,  nausea, muscle aches, no difficulty breathing, no calf pain, no chest pain or shortness of breath.   Physical Exam  GENERAL APPEARANCE: Alert, conversant. Appropriately groomed. No acute distress.   VASCULAR: Pedal pulses palpable 2/4 DP and 2/4 PT bilateral.  Capillary refill time is immediate to all digits,  Proximal to distal cooling is warm to warm.  Digital perfusion adequate.   NEUROLOGIC: sensation is intact to 5.07 monofilament at 5/5 sites bilateral.  Light touch is intact bilateral, vibratory sensation intact bilateral  MUSCULOSKELETAL: acceptable muscle strength, tone and stability bilateral.  No gross boney pedal deformities noted.  No pain, crepitus or limitation noted with foot and ankle range of motion bilateral.   DERMATOLOGIC: skin is warm, supple, and dry.  Color, texture, and turgor of skin within normal limits.  No open wounds are noted.  No preulcerative lesions are seen.   Left hallux nail avulsed and partially attached.  No redness, no swelling, no drainage or sign of infection noted.  No avulsion of the underlying nailbed noted- no exposed bone or probing to bone noted.     Assessment     ICD-10-CM   1. Injury of toenail of left foot, initial encounter  Z61.096E          Plan  Recommended removing the nail - non permanent- as it has almost fully avulsed.  She agreed.  The toe was prepped with alcohol and I infiltrated a one-to-one mix of 0.5% Marcaine plain and 2% lidocaine plain in a digital block fashion.  The toe was then prepped with Betadine and the avulsed nail was then carefully removed- no probing to bone was noted.  The area was cleansed well with Betadine solution.  It was then dressed with antibiotic ointment and a non stick,  dry and sterile compressive dressing.  She was given instructions for soaks and aftercare as well as a prescription for mupirocin ointment.  I advised her on how the new nail will grow and recommended when it starts growing to  use vaseline on the nailbed to allow for better regrowth.   She will call if any signs of infection arise including but not limited to redness, swelling or drainage-   she will be seen back if any of these symptoms arise or if any questions arise.

## 2022-06-24 NOTE — Patient Instructions (Addendum)
Soak Instructions    THE DAY AFTER THE PROCEDURE  Place 1/4 cup of epsom salts in a quart of warm tap water.  Submerge your foot or feet with outer bandage intact for the initial soak; this will allow the bandage to become moist and wet for easy lift off.  Once you remove your bandage, continue to soak in the solution for 20 minutes.  .  Next, remove your foot or feet from solution, blot dry the affected area with gauze or a papertowel and cover.  Apply mupirocin ointment (RX), or polysporin   You may use a band aid large enough to cover the area or use gauze and tape.    **This soak should be done twice a day for the next 5-7 days.- after that time you may switch to keeping it clean with soap and water in the shower.  You may discontinue use of the bandaid at night after about 2 weeks.  You may discontinue use of the bandaid during the day in shoes when there is no drainage on the bandaid.   IF YOUR SKIN BECOMES IRRITATED WHILE USING THESE INSTRUCTIONS, IT IS OKAY TO SWITCH TO  antibacterial soap pump soap (Dial)  and water to keep the toe clean instead of soaking in epsom salts.

## 2022-08-30 ENCOUNTER — Ambulatory Visit
Admission: RE | Admit: 2022-08-30 | Discharge: 2022-08-30 | Disposition: A | Discharge status: Home / Self Care | Payer: 59 | Source: Ambulatory Visit | Attending: Family Medicine | Admitting: Family Medicine

## 2022-08-30 VITALS — BP 172/116 | HR 87 | Temp 98.4°F | Resp 13

## 2022-08-30 DIAGNOSIS — R102 Pelvic and perineal pain: Secondary | ICD-10-CM | POA: Diagnosis present

## 2022-08-30 DIAGNOSIS — R52 Pain, unspecified: Secondary | ICD-10-CM | POA: Diagnosis not present

## 2022-08-30 DIAGNOSIS — Z20822 Contact with and (suspected) exposure to covid-19: Secondary | ICD-10-CM | POA: Insufficient documentation

## 2022-08-30 DIAGNOSIS — Z20828 Contact with and (suspected) exposure to other viral communicable diseases: Secondary | ICD-10-CM | POA: Insufficient documentation

## 2022-08-30 DIAGNOSIS — R112 Nausea with vomiting, unspecified: Secondary | ICD-10-CM | POA: Insufficient documentation

## 2022-08-30 DIAGNOSIS — R8290 Unspecified abnormal findings in urine: Secondary | ICD-10-CM | POA: Insufficient documentation

## 2022-08-30 LAB — POCT URINALYSIS DIP (MANUAL ENTRY)
Glucose, UA: NEGATIVE mg/dL
Ketones, POC UA: NEGATIVE mg/dL
Nitrite, UA: POSITIVE — AB
Protein Ur, POC: 30 mg/dL — AB
Spec Grav, UA: >=1.03 — AB (ref 1.010–1.025)
Urobilinogen, UA: 1 E.U./dL
pH, UA: 6 (ref 5.0–8.0)

## 2022-08-30 LAB — POCT INFLUENZA A/B
Influenza A, POC: NEGATIVE
Influenza B, POC: NEGATIVE

## 2022-08-30 MED ORDER — KETOROLAC TROMETHAMINE 30 MG/ML IJ SOLN
30.0000 mg | Freq: Once | INTRAMUSCULAR | Status: AC
  Administered 2022-08-30: 30 mg via INTRAMUSCULAR

## 2022-08-30 MED ORDER — ONDANSETRON 4 MG PO TBDP: 4.0000 mg | ORAL_TABLET | Freq: Three times a day (TID) | ORAL | 0 refills | Status: DC | PRN

## 2022-08-30 MED ORDER — NITROFURANTOIN MONOHYD MACRO 100 MG PO CAPS: 100.0000 mg | ORAL_CAPSULE | Freq: Two times a day (BID) | ORAL | 0 refills | Status: DC

## 2022-08-30 NOTE — ED Provider Notes (Signed)
[facility]    CSN: [id number] Arrival date & time: 08/30/22  0902      History   Chief Complaint Chief Complaint  Patient presents with   Generalized Body Aches    Body aches, Stomach pain, nausea, fever 100.4, stuffy nose - Entered by patient    HPI RYLANN TOLLY is a 27 y.o. female.   Patient presenting today with 1 day history of mild abdominal pain, nausea, body aches, fever.  Denies vomiting, diarrhea, constipation, cough, chest pain, shortness of breath, sore throat.  So far tried an Azo as she is prone to urinary tract infections but did not find any relief from this.  She states exposures to COVID, flu and a stomach virus at work.    Past Medical History:  Diagnosis Date   Headache    Hypertension    Panic attacks    PONV (postoperative nausea and vomiting)     Patient Active Problem List   Diagnosis Date Noted   Menorrhagia with regular cycle 04/12/2021   Pregnancy examination or test, negative result 04/12/2021   Routine cervical smear 04/12/2021   Uterine cramping 04/12/2021   Sebaceous cyst of left axilla 01/22/2019   Chronic hypertension 02/01/2018   Foreign body, hand, superficial     Past Surgical History:  Procedure Laterality Date   CYST EXCISION Left 01/25/2019   Procedure: CYST REMOVAL;  Surgeon: Lucretia Roers, MD;  Location: [facility];  Service: General;  Laterality: Left;   FOREIGN BODY REMOVAL Left 09/30/2015   Procedure: FOREIGN BODY REMOVAL ADULT;  Surgeon: Vickki Hearing, MD;  Location: [facility];  Service: Orthopedics;  Laterality: Left;  LEFT HAND   WISDOM TOOTH EXTRACTION Bilateral     OB History     Gravida  0   Para  0   Term      Preterm      AB      Living         SAB      IAB      Ectopic      Multiple      Live Births               Home Medications    Prior to Admission medications   Medication Sig Start Date End Date Taking? Authorizing Provider  nitrofurantoin,  macrocrystal-monohydrate, (MACROBID) 100 MG capsule Take 1 capsule (100 mg total) by mouth 2 (two) times daily. 08/30/22  Yes Particia Nearing, PA-C  ondansetron (ZOFRAN-ODT) 4 MG disintegrating tablet Take 1 tablet (4 mg total) by mouth every 8 (eight) hours as needed for nausea or vomiting. 08/30/22  Yes Particia Nearing, PA-C  cyclobenzaprine (FLEXERIL) 5 MG tablet Take 1 tablet (5 mg total) by mouth 3 (three) times daily as needed for muscle spasms. Do not drink alcohol or drive while taking this medication.  May cause drowsiness. Patient not taking: Reported on 06/24/2022 06/16/21   Particia Nearing, PA-C  fluconazole (DIFLUCAN) 150 MG tablet Take 1 tablet (150 mg total) by mouth once a week. Patient not taking: Reported on 06/24/2022 04/06/22   Particia Nearing, PA-C  fluticasone [facility]) 50 MCG/ACT nasal spray Place 2 sprays into both nostrils daily. Patient not taking: Reported on 06/24/2022 12/13/21   Leath-Warren, Sadie Haber, NP  hydrochlorothiazide (HYDRODIURIL) 25 MG tablet Take 25 mg by mouth daily.    [provider]  hydrOXYzine (ATARAX) 25 MG tablet Take 0.5-1 tablets (12.5-25 mg total) by mouth every  8 (eight) hours as needed for itching. Patient not taking: Reported on 06/24/2022 04/20/21   Wallis Bamberg, PA-C  ibuprofen (ADVIL) 800 MG tablet Take 1 tablet (800 mg total) by mouth 3 (three) times daily. 01/06/22   Leath-Warren, Sadie Haber, NP  megestrol (MEGACE) 40 MG tablet Take 3 x 5 days then 2 x 5 days then 1 daily til bleeding stops Patient not taking: Reported on 06/24/2022 04/12/21   Adline Potter, NP  Multiple Vitamin (MULTIVITAMIN WITH MINERALS) TABS tablet Take 2 tablets by mouth daily. Women's One-A-Day  Patient not taking: Reported on 06/24/2022    [provider]  mupirocin ointment (BACTROBAN) 2 % Apply 1 Application topically 2 (two) times daily. Apply to toe and cover 06/24/22   Delories Heinz, DPM  naproxen (NAPROSYN) 500 MG tablet  Take 1 tablet (500 mg total) by mouth 2 (two) times daily as needed. Patient not taking: Reported on 06/24/2022 06/16/21   Particia Nearing, PA-C  nitrofurantoin, macrocrystal-monohydrate, (MACROBID) 100 MG capsule Take 1 capsule (100 mg total) by mouth 2 (two) times daily. 12/13/21   Leath-Warren, Sadie Haber, NP  nitrofurantoin, macrocrystal-monohydrate, (MACROBID) 100 MG capsule Take 1 capsule (100 mg total) by mouth 2 (two) times daily. Patient not taking: Reported on 06/24/2022 04/06/22   Particia Nearing, PA-C  oseltamivir (TAMIFLU) 75 MG capsule Take 1 capsule (75 mg total) by mouth every 12 (twelve) hours. Patient not taking: Reported on 06/24/2022 01/06/22   Leath-Warren, Sadie Haber, NP  phenazopyridine (PYRIDIUM) 200 MG tablet Take 1 tablet (200 mg total) by mouth 3 (three) times daily as needed for pain. Patient not taking: Reported on 06/24/2022 04/06/22   Particia Nearing, PA-C  promethazine-dextromethorphan (PROMETHAZINE-DM) 6.25-15 MG/5ML syrup Take 5 mLs by mouth 4 (four) times daily as needed for cough. Patient not taking: Reported on 06/24/2022 12/13/21   Leath-Warren, Sadie Haber, NP  triamcinolone cream (KENALOG) 0.1 % Apply 1 application. topically 2 (two) times daily. Patient not taking: Reported on 06/24/2022 04/20/21   Wallis Bamberg, PA-C  amLODipine (NORVASC) 5 MG tablet Take 1 tablet (5 mg total) by mouth daily. 03/09/18 01/05/19  Adline Potter, NP    Family History Family History  Problem Relation Age of Onset   Hypertension Mother    Hypertension Father    Cancer Paternal Grandfather        lung   Cancer Paternal Grandmother        lung   Hypertension Maternal Grandmother    Alcohol abuse Maternal Grandfather     Social History Social History   Tobacco Use   Smoking status: Never   Smokeless tobacco: Never  Vaping Use   Vaping status: Never Used  Substance Use Topics   Alcohol use: Not Currently    Comment: occasional   Drug use: No      Allergies   Amoxicillin, Cefdinir, Other, and Smz-tmp ds [sulfamethoxazole-trimethoprim]   Review of Systems Review of Systems Per HPI  Physical Exam Triage Vital Signs ED Triage Vitals  Encounter Vitals Group     BP 08/30/22 0933 (!) 172/116     Systolic BP Percentile --      Diastolic BP Percentile --      Pulse Rate 08/30/22 0933 87     Resp 08/30/22 0933 13     Temp 08/30/22 0933 98.4 F (36.9 C)     Temp Source 08/30/22 0933 Oral     SpO2 08/30/22 0933 99 %     Weight --  Height --      Head Circumference --      Peak Flow --      Pain Score 08/30/22 0936 5     Pain Loc --      Pain Education --      Exclude from Growth Chart --    No data found.  Updated Vital Signs BP (!) 172/116 (BP Location: Right Arm)   Pulse 87   Temp 98.4 F (36.9 C) (Oral)   Resp 13   LMP 08/16/2022 (Exact Date)   SpO2 99%   Visual Acuity Right Eye Distance:   Left Eye Distance:   Bilateral Distance:    Right Eye Near:   Left Eye Near:    Bilateral Near:     Physical Exam Vitals and nursing note reviewed.  Constitutional:      Appearance: Normal appearance. She is not ill-appearing.  HENT:     Head: Atraumatic.     Mouth/Throat:     Mouth: Mucous membranes are moist.     Pharynx: Oropharynx is clear.  Eyes:     Extraocular Movements: Extraocular movements intact.     Conjunctiva/sclera: Conjunctivae normal.  Cardiovascular:     Rate and Rhythm: Normal rate and regular rhythm.     Heart sounds: Normal heart sounds.  Pulmonary:     Effort: Pulmonary effort is normal.     Breath sounds: Normal breath sounds.  Abdominal:     General: Bowel sounds are normal. There is no distension.     Palpations: Abdomen is soft.     Tenderness: There is abdominal tenderness. There is no right CVA tenderness, left CVA tenderness or guarding.     Comments: Mild suprapubic tenderness to palpation without distention or guarding  Musculoskeletal:        General: Normal range  of motion.     Cervical back: Normal range of motion and neck supple.  Skin:    General: Skin is warm and dry.  Neurological:     Mental Status: She is alert and oriented to person, place, and time.  Psychiatric:        Mood and Affect: Mood normal.        Thought Content: Thought content normal.        Judgment: Judgment normal.      UC Treatments / Results  Labs (all labs ordered are listed, but only abnormal results are displayed) Labs Reviewed  POCT URINALYSIS DIP (MANUAL ENTRY) - Abnormal; Notable for the following components:      Result Value   Bilirubin, UA small (*)    Spec Grav, UA >=1.030 (*)    Blood, UA trace-intact (*)    Protein Ur, POC =30 (*)    Nitrite, UA Positive (*)    Leukocytes, UA Trace (*)    All other components within normal limits  SARS CORONAVIRUS 2 (TAT 6-24 HRS)  URINE CULTURE  POCT INFLUENZA A/B    EKG   Radiology No results found.  Procedures Procedures (including critical care time)  Medications Ordered in UC Medications  ketorolac (TORADOL) 30 MG/ML injection 30 mg (30 mg Intramuscular Given 08/30/22 1021)    Initial Impression / Assessment and Plan / UC Course  I have reviewed the triage vital signs and the nursing notes.  Pertinent labs & imaging results that were available during my care of the patient were reviewed by me and considered in my medical decision making (see chart for details).     Hypertensive  in triage, otherwise vital signs within normal limits.  Her exam is overall reassuring with no red flag findings.  She is requesting something for pain so IM Toradol given in clinic.  Rapid flu negative, COVID test pending.  Urinalysis with evidence of a possible UTI though she has taken Azo recently.  Urine culture pending for further evaluation.  Macrobid, Zofran sent while awaiting remainder of results.  Work note given.  Return for worsening symptoms.  Final Clinical Impressions(s) / UC Diagnoses   Final diagnoses:   Suprapubic pain  Abnormal urinalysis  Nausea and vomiting, unspecified vomiting type  Exposure to COVID-19 virus  Exposure to influenza   Discharge Instructions   None    ED Prescriptions     Medication Sig Dispense Auth. Provider   nitrofurantoin, macrocrystal-monohydrate, (MACROBID) 100 MG capsule Take 1 capsule (100 mg total) by mouth 2 (two) times daily. 10 capsule Particia Nearing, PA-C   ondansetron (ZOFRAN-ODT) 4 MG disintegrating tablet Take 1 tablet (4 mg total) by mouth every 8 (eight) hours as needed for nausea or vomiting. 20 tablet Particia Nearing, [place]      PDMP not reviewed this encounter.   Particia Nearing, [place] 08/30/22 1104

## 2022-08-30 NOTE — ED Triage Notes (Addendum)
Pt c/o abdominal pain, nausea, body aches and fever, was sent homes nasal congestion, headache. 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

## 2022-08-31 LAB — URINE CULTURE

## 2022-08-31 LAB — SARS CORONAVIRUS 2 (TAT 6-24 HRS): SARS Coronavirus 2: NEGATIVE

## 2022-11-30 ENCOUNTER — Ambulatory Visit
Admission: EM | Admit: 2022-11-30 | Discharge: 2022-11-30 | Disposition: A | Discharge status: Home / Self Care | Payer: 59

## 2022-11-30 ENCOUNTER — Other Ambulatory Visit: Payer: Self-pay

## 2022-11-30 ENCOUNTER — Encounter: Payer: Self-pay | Admitting: Emergency Medicine

## 2022-11-30 DIAGNOSIS — J069 Acute upper respiratory infection, unspecified: Secondary | ICD-10-CM

## 2022-11-30 LAB — POC COVID19/FLU A&B COMBO
Covid Antigen, POC: NEGATIVE
Influenza A Antigen, POC: NEGATIVE
Influenza B Antigen, POC: NEGATIVE

## 2022-11-30 MED ORDER — PROMETHAZINE-DM 6.25-15 MG/5ML PO SYRP: 5.0000 mL | ORAL_SOLUTION | Freq: Four times a day (QID) | ORAL | 0 refills | Status: DC | PRN

## 2022-11-30 NOTE — ED Provider Notes (Signed)
[facility]    CSN: [id number] Arrival date & time: 11/30/22  1308      History   Chief Complaint Chief Complaint  Patient presents with   Headache    HPI Elaine Hernandez is a 27 y.o. female.   Patient presenting today with 2-day history of headache, sinus pain and pressure, congestion, intermittent fevers, cough.  Denies chest pain, shortness of breath, abdominal pain, nausea vomiting or diarrhea.  So far trying DayQuil, over-the-counter pain relievers with minimal relief.  Multiple sick contacts at work.  No known history of chronic pulmonary disease.    Past Medical History:  Diagnosis Date   Headache    Hypertension    Panic attacks    PONV (postoperative nausea and vomiting)     Patient Active Problem List   Diagnosis Date Noted   Menorrhagia with regular cycle 04/12/2021   Pregnancy examination or test, negative result 04/12/2021   Routine cervical smear 04/12/2021   Uterine cramping 04/12/2021   Sebaceous cyst of left axilla 01/22/2019   Chronic hypertension 02/01/2018   Foreign body, hand, superficial     Past Surgical History:  Procedure Laterality Date   CYST EXCISION Left 01/25/2019   Procedure: CYST REMOVAL;  Surgeon: Lucretia Roers, MD;  Location: [facility];  Service: General;  Laterality: Left;   FOREIGN BODY REMOVAL Left 09/30/2015   Procedure: FOREIGN BODY REMOVAL ADULT;  Surgeon: Vickki Hearing, MD;  Location: [facility];  Service: Orthopedics;  Laterality: Left;  LEFT HAND   WISDOM TOOTH EXTRACTION Bilateral     OB History     Gravida  0   Para  0   Term      Preterm      AB      Living         SAB      IAB      Ectopic      Multiple      Live Births               Home Medications    Prior to Admission medications   Medication Sig Start Date End Date Taking? Authorizing Provider  olmesartan (BENICAR) 20 MG tablet Take 20 mg by mouth daily.   Yes [provider]  promethazine-dextromethorphan  (PROMETHAZINE-DM) 6.25-15 MG/5ML syrup Take 5 mLs by mouth 4 (four) times daily as needed. 11/30/22  Yes Particia Nearing, PA-C  cyclobenzaprine (FLEXERIL) 5 MG tablet Take 1 tablet (5 mg total) by mouth 3 (three) times daily as needed for muscle spasms. Do not drink alcohol or drive while taking this medication.  May cause drowsiness. Patient not taking: Reported on 06/24/2022 06/16/21   Particia Nearing, PA-C  fluconazole (DIFLUCAN) 150 MG tablet Take 1 tablet (150 mg total) by mouth once a week. Patient not taking: Reported on 06/24/2022 04/06/22   Particia Nearing, PA-C  fluticasone [facility]) 50 MCG/ACT nasal spray Place 2 sprays into both nostrils daily. Patient not taking: Reported on 06/24/2022 12/13/21   Leath-Warren, Sadie Haber, NP  hydrochlorothiazide (HYDRODIURIL) 25 MG tablet Take 25 mg by mouth daily.    [provider]  hydrOXYzine (ATARAX) 25 MG tablet Take 0.5-1 tablets (12.5-25 mg total) by mouth every 8 (eight) hours as needed for itching. Patient not taking: Reported on 06/24/2022 04/20/21   Wallis Bamberg, PA-C  ibuprofen (ADVIL) 800 MG tablet Take 1 tablet (800 mg total) by mouth 3 (three) times daily. 01/06/22   Leath-Warren, Sadie Haber,  NP  megestrol (MEGACE) 40 MG tablet Take 3 x 5 days then 2 x 5 days then 1 daily til bleeding stops Patient not taking: Reported on 06/24/2022 04/12/21   Adline Potter, NP  Multiple Vitamin (MULTIVITAMIN WITH MINERALS) TABS tablet Take 2 tablets by mouth daily. Women's One-A-Day  Patient not taking: Reported on 06/24/2022    [provider]  mupirocin ointment (BACTROBAN) 2 % Apply 1 Application topically 2 (two) times daily. Apply to toe and cover 06/24/22   Delories Heinz, DPM  naproxen (NAPROSYN) 500 MG tablet Take 1 tablet (500 mg total) by mouth 2 (two) times daily as needed. Patient not taking: Reported on 06/24/2022 06/16/21   Particia Nearing, PA-C  nitrofurantoin, macrocrystal-monohydrate, (MACROBID)  100 MG capsule Take 1 capsule (100 mg total) by mouth 2 (two) times daily. 12/13/21   Leath-Warren, Sadie Haber, NP  nitrofurantoin, macrocrystal-monohydrate, (MACROBID) 100 MG capsule Take 1 capsule (100 mg total) by mouth 2 (two) times daily. Patient not taking: Reported on 06/24/2022 04/06/22   Particia Nearing, PA-C  nitrofurantoin, macrocrystal-monohydrate, (MACROBID) 100 MG capsule Take 1 capsule (100 mg total) by mouth 2 (two) times daily. 08/30/22   Particia Nearing, PA-C  ondansetron (ZOFRAN-ODT) 4 MG disintegrating tablet Take 1 tablet (4 mg total) by mouth every 8 (eight) hours as needed for nausea or vomiting. 08/30/22   Particia Nearing, PA-C  oseltamivir (TAMIFLU) 75 MG capsule Take 1 capsule (75 mg total) by mouth every 12 (twelve) hours. Patient not taking: Reported on 06/24/2022 01/06/22   Leath-Warren, Sadie Haber, NP  phenazopyridine (PYRIDIUM) 200 MG tablet Take 1 tablet (200 mg total) by mouth 3 (three) times daily as needed for pain. Patient not taking: Reported on 06/24/2022 04/06/22   Particia Nearing, PA-C  promethazine-dextromethorphan (PROMETHAZINE-DM) 6.25-15 MG/5ML syrup Take 5 mLs by mouth 4 (four) times daily as needed for cough. Patient not taking: Reported on 06/24/2022 12/13/21   Leath-Warren, Sadie Haber, NP  triamcinolone cream (KENALOG) 0.1 % Apply 1 application. topically 2 (two) times daily. Patient not taking: Reported on 06/24/2022 04/20/21   Wallis Bamberg, PA-C  amLODipine (NORVASC) 5 MG tablet Take 1 tablet (5 mg total) by mouth daily. 03/09/18 01/05/19  Adline Potter, NP    Family History Family History  Problem Relation Age of Onset   Hypertension Mother    Hypertension Father    Cancer Paternal Grandfather        lung   Cancer Paternal Grandmother        lung   Hypertension Maternal Grandmother    Alcohol abuse Maternal Grandfather     Social History Social History   Tobacco Use   Smoking status: Never   Smokeless tobacco:  Never  Vaping Use   Vaping status: Never Used  Substance Use Topics   Alcohol use: Not Currently    Comment: occasional   Drug use: No     Allergies   Amoxicillin, Cefdinir, Other, and Smz-tmp ds [sulfamethoxazole-trimethoprim]   Review of Systems Review of Systems Per HPI  Physical Exam Triage Vital Signs ED Triage Vitals [11/30/22 0928]  Encounter Vitals Group     BP (!) 161/87     Systolic BP Percentile      Diastolic BP Percentile      Pulse Rate 94     Resp 20     Temp 98.3 F (36.8 C)     Temp Source Oral     SpO2 99 %  Weight      Height      Head Circumference      Peak Flow      Pain Score 4     Pain Loc      Pain Education      Exclude from Growth Chart    No data found.  Updated Vital Signs BP (!) 161/87 (BP Location: Right Arm)   Pulse 94   Temp 98.3 F (36.8 C) (Oral)   Resp 20   LMP 11/04/2022 (Approximate)   SpO2 99%   Visual Acuity Right Eye Distance:   Left Eye Distance:   Bilateral Distance:    Right Eye Near:   Left Eye Near:    Bilateral Near:     Physical Exam Vitals and nursing note reviewed.  Constitutional:      Appearance: Normal appearance.  HENT:     Head: Atraumatic.     Right Ear: Tympanic membrane and external ear normal.     Left Ear: Tympanic membrane and external ear normal.     Nose: Rhinorrhea present.     Mouth/Throat:     Mouth: Mucous membranes are moist.     Pharynx: Posterior oropharyngeal erythema present.  Eyes:     Extraocular Movements: Extraocular movements intact.     Conjunctiva/sclera: Conjunctivae normal.  Cardiovascular:     Rate and Rhythm: Normal rate and regular rhythm.     Heart sounds: Normal heart sounds.  Pulmonary:     Effort: Pulmonary effort is normal.     Breath sounds: Normal breath sounds. No wheezing.  Musculoskeletal:        General: Normal range of motion.     Cervical back: Normal range of motion and neck supple.  Skin:    General: Skin is warm and dry.   Neurological:     Mental Status: She is alert and oriented to person, place, and time.  Psychiatric:        Mood and Affect: Mood normal.        Thought Content: Thought content normal.      UC Treatments / Results  Labs (all labs ordered are listed, but only abnormal results are displayed) Labs Reviewed  POC COVID19/FLU A&B COMBO    EKG   Radiology No results found.  Procedures Procedures (including critical care time)  Medications Ordered in UC Medications - No data to display  Initial Impression / Assessment and Plan / UC Course  I have reviewed the triage vital signs and the nursing notes.  Pertinent labs & imaging results that were available during my care of the patient were reviewed by me and considered in my medical decision making (see chart for details).     Mildly hypertensive in triage, otherwise vital signs reassuring.  She is overall well-appearing and in no acute distress.  Suspect viral upper respiratory infection.  Rapid COVID and flu negative, treat with Phenergan DM, supportive over-the-counter medications at home care.  Work note given.  Return for worsening symptoms.  Final Clinical Impressions(s) / UC Diagnoses   Final diagnoses:  Viral URI with cough   Discharge Instructions   None    ED Prescriptions     Medication Sig Dispense Auth. Provider   promethazine-dextromethorphan (PROMETHAZINE-DM) 6.25-15 MG/5ML syrup Take 5 mLs by mouth 4 (four) times daily as needed. 100 mL Particia Nearing, [place]      PDMP not reviewed this encounter.   Particia Nearing, [place] 11/30/22 1057

## 2022-11-30 NOTE — ED Triage Notes (Signed)
Pt reports headache, facial pressure, nasal congestion, intermittent fever, dizziness x2 days.

## 2023-02-09 ENCOUNTER — Ambulatory Visit
Admission: RE | Admit: 2023-02-09 | Discharge: 2023-02-09 | Disposition: A | Discharge status: Home / Self Care | Payer: 59 | Source: Ambulatory Visit | Attending: Nurse Practitioner | Admitting: Nurse Practitioner

## 2023-02-09 VITALS — BP 134/87 | HR 88 | Temp 98.7°F | Resp 16

## 2023-02-09 DIAGNOSIS — J101 Influenza due to other identified influenza virus with other respiratory manifestations: Secondary | ICD-10-CM | POA: Diagnosis not present

## 2023-02-09 LAB — POCT INFLUENZA A/B
Influenza A, POC: POSITIVE — AB
Influenza B, POC: NEGATIVE

## 2023-02-09 MED ORDER — OSELTAMIVIR PHOSPHATE 75 MG PO CAPS: 75.0000 mg | ORAL_CAPSULE | Freq: Two times a day (BID) | ORAL | 0 refills | Status: AC

## 2023-02-09 MED ORDER — BENZONATATE 100 MG PO CAPS: 100.0000 mg | ORAL_CAPSULE | Freq: Three times a day (TID) | ORAL | 0 refills | Status: DC | PRN

## 2023-02-09 NOTE — ED Triage Notes (Signed)
Pt states she started with headache on Friday, then sore throat on Saturday. She has since developed cough, congestion, ear pain and fever 101 at home. She is taking tylenol cold and flu.

## 2023-02-09 NOTE — Discharge Instructions (Signed)
You have influenza A.  Symptoms should improve over the next few days.  If you develop chest pain or shortness of breath, go to the emergency room.  Some things that can make you feel better are: - Increased rest - Increasing fluid with water/sugar free electrolytes - Acetaminophen and ibuprofen as needed for fever/pain - Salt water gargling, chloraseptic spray and throat lozenges - OTC guaifenesin (Mucinex) 600 mg twice daily - Saline sinus flushes or a neti pot - Humidifying the air -Tessalon Perles every 8 hours as needed for dry cough

## 2023-02-09 NOTE — ED Provider Notes (Signed)
[facility]    CSN: [id number] Arrival date & time: 02/09/23  0836      History   Chief Complaint Chief Complaint  Patient presents with   Generalized Body Aches    Body aches , coughing, fever, runny nose, congestion - Entered by patient   Cough   Nasal Congestion   Headache   Fever    HPI Elaine Hernandez is a 28 y.o. female.   Patient presents today for 2 day history of fever, TMAX 102, body aches, chills, dry cough, chest pain and rib cage pain when she coughs, stuffy and runny nose, left ear pressure, decreased appetite, and fatigue.  She also had a headache 6 days ago that is now fully resolved.  No abdominal pain, nausea/vomiting, or diarrhea.  Her boyfriend is sick with similar symptoms.  Has been exposed to strep throat, influenza, and a stomach bug at work.  Has taken Tylenol cold and flu daytime/nighttime with slight improvement in symptoms.    Past Medical History:  Diagnosis Date   Headache    Hypertension    Panic attacks    PONV (postoperative nausea and vomiting)     Patient Active Problem List   Diagnosis Date Noted   Menorrhagia with regular cycle 04/12/2021   Pregnancy examination or test, negative result 04/12/2021   Routine cervical smear 04/12/2021   Uterine cramping 04/12/2021   Sebaceous cyst of left axilla 01/22/2019   Chronic hypertension 02/01/2018   Foreign body, hand, superficial     Past Surgical History:  Procedure Laterality Date   CYST EXCISION Left 01/25/2019   Procedure: CYST REMOVAL;  Surgeon: Lucretia Roers, MD;  Location: [facility];  Service: General;  Laterality: Left;   FOREIGN BODY REMOVAL Left 09/30/2015   Procedure: FOREIGN BODY REMOVAL ADULT;  Surgeon: Vickki Hearing, MD;  Location: [facility];  Service: Orthopedics;  Laterality: Left;  LEFT HAND   WISDOM TOOTH EXTRACTION Bilateral     OB History     Gravida  0   Para  0   Term      Preterm      AB      Living         SAB      IAB       Ectopic      Multiple      Live Births               Home Medications    Prior to Admission medications   Medication Sig Start Date End Date Taking? Authorizing Provider  benzonatate (TESSALON) 100 MG capsule Take 1 capsule (100 mg total) by mouth 3 (three) times daily as needed for cough. Do not take with alcohol or while operating or driving heavy machinery 8/75/64  Yes Cathlean Marseilles A, NP  hydrochlorothiazide (HYDRODIURIL) 25 MG tablet Take 25 mg by mouth daily.   Yes [provider]  olmesartan (BENICAR) 20 MG tablet Take 20 mg by mouth daily.   Yes [provider]  oseltamivir (TAMIFLU) 75 MG capsule Take 1 capsule (75 mg total) by mouth every 12 (twelve) hours for 5 days. 02/09/23 02/14/23 Yes Cathlean Marseilles A, NP  amLODipine (NORVASC) 5 MG tablet Take 1 tablet (5 mg total) by mouth daily. 03/09/18 01/05/19  Adline Potter, NP    Family History Family History  Problem Relation Age of Onset   Hypertension Mother    Hypertension Father    Cancer Paternal Grandfather  lung   Cancer Paternal Grandmother        lung   Hypertension Maternal Grandmother    Alcohol abuse Maternal Grandfather     Social History Social History   Tobacco Use   Smoking status: Never   Smokeless tobacco: Never  Vaping Use   Vaping status: Never Used  Substance Use Topics   Alcohol use: Not Currently    Comment: occasional   Drug use: No     Allergies   Amoxicillin, Cefdinir, Other, and Smz-tmp ds [sulfamethoxazole-trimethoprim]   Review of Systems Review of Systems Per HPI  Physical Exam Triage Vital Signs ED Triage Vitals [02/09/23 0903]  Encounter Vitals Group     BP 134/87     Systolic BP Percentile      Diastolic BP Percentile      Pulse Rate 88     Resp 16     Temp 98.7 F (37.1 C)     Temp Source Oral     SpO2 97 %     Weight      Height      Head Circumference      Peak Flow      Pain Score      Pain Loc      Pain Education       Exclude from Growth Chart    No data found.  Updated Vital Signs BP 134/87 (BP Location: Right Arm)   Pulse 88   Temp 98.7 F (37.1 C) (Oral)   Resp 16   LMP 01/24/2023 (Exact Date)   SpO2 97%   Visual Acuity Right Eye Distance:   Left Eye Distance:   Bilateral Distance:    Right Eye Near:   Left Eye Near:    Bilateral Near:     Physical Exam Vitals and nursing note reviewed.  Constitutional:      General: She is not in acute distress.    Appearance: Normal appearance. She is ill-appearing. She is not toxic-appearing.  HENT:     Head: Normocephalic and atraumatic.     Right Ear: Tympanic membrane, ear canal and external ear normal.     Left Ear: Tympanic membrane, ear canal and external ear normal.     Nose: No congestion or rhinorrhea.     Mouth/Throat:     Mouth: Mucous membranes are moist.     Pharynx: Oropharynx is clear. Posterior oropharyngeal erythema present. No oropharyngeal exudate.  Eyes:     General: No scleral icterus.    Extraocular Movements: Extraocular movements intact.  Cardiovascular:     Rate and Rhythm: Normal rate and regular rhythm.  Pulmonary:     Effort: Pulmonary effort is normal. No respiratory distress.     Breath sounds: Normal breath sounds. No wheezing, rhonchi or rales.  Musculoskeletal:     Cervical back: Normal range of motion and neck supple.  Lymphadenopathy:     Cervical: No cervical adenopathy.  Skin:    General: Skin is warm and dry.     Coloration: Skin is not jaundiced or pale.     Findings: No erythema or rash.  Neurological:     Mental Status: She is alert and oriented to person, place, and time.  Psychiatric:        Behavior: Behavior is cooperative.      UC Treatments / Results  Labs (all labs ordered are listed, but only abnormal results are displayed) Labs Reviewed  POCT INFLUENZA A/B - Abnormal; Notable for the following components:  Result Value   Influenza A, POC Positive (*)    All other  components within normal limits    EKG   Radiology No results found.  Procedures Procedures (including critical care time)  Medications Ordered in UC Medications - No data to display  Initial Impression / Assessment and Plan / UC Course  I have reviewed the triage vital signs and the nursing notes.  Pertinent labs & imaging results that were available during my care of the patient were reviewed by me and considered in my medical decision making (see chart for details).   Patient is well-appearing, normotensive, afebrile, not tachycardic, not tachypneic, oxygenating well on room air.    1. Influenza A Vitals and examination are reassuring  Start Tamiflu twice daily for 5 days Other supportive care discussed, start cough suppressant medication ER and return precautions discussed Work excuse given  The patient was given the opportunity to ask questions.  All questions answered to their satisfaction.  The patient is in agreement to this plan.   Final Clinical Impressions(s) / UC Diagnoses   Final diagnoses:  Influenza A     Discharge Instructions      You have influenza A.  Symptoms should improve over the next few days.  If you develop chest pain or shortness of breath, go to the emergency room.  Some things that can make you feel better are: - Increased rest - Increasing fluid with water/sugar free electrolytes - Acetaminophen and ibuprofen as needed for fever/pain - Salt water gargling, chloraseptic spray and throat lozenges - OTC guaifenesin (Mucinex) 600 mg twice daily - Saline sinus flushes or a neti pot - Humidifying the air -Tessalon Perles every 8 hours as needed for dry cough      ED Prescriptions     Medication Sig Dispense Auth. Provider   oseltamivir (TAMIFLU) 75 MG capsule Take 1 capsule (75 mg total) by mouth every 12 (twelve) hours for 5 days. 10 capsule Cathlean Marseilles A, NP   benzonatate (TESSALON) 100 MG capsule Take 1 capsule (100 mg total)  by mouth 3 (three) times daily as needed for cough. Do not take with alcohol or while operating or driving heavy machinery 21 capsule Valentino Nose, NP      PDMP not reviewed this encounter.   Valentino Nose, NP 02/09/23 [phone]

## 2023-02-17 ENCOUNTER — Ambulatory Visit
Admission: RE | Admit: 2023-02-17 | Discharge: 2023-02-17 | Disposition: A | Discharge status: Home / Self Care | Payer: 59 | Source: Ambulatory Visit | Attending: Family Medicine

## 2023-02-17 VITALS — BP 140/89 | HR 105 | Temp 99.5°F | Resp 20

## 2023-02-17 DIAGNOSIS — J0300 Acute streptococcal tonsillitis, unspecified: Secondary | ICD-10-CM | POA: Diagnosis not present

## 2023-02-17 LAB — POCT RAPID STREP A (OFFICE): Rapid Strep A Screen: POSITIVE — AB

## 2023-02-17 MED ORDER — LIDOCAINE VISCOUS HCL 2 % MT SOLN: 10.0000 mL | OROMUCOSAL | 0 refills | Status: DC | PRN

## 2023-02-17 MED ORDER — FLUCONAZOLE 150 MG PO TABS: 150.0000 mg | ORAL_TABLET | Freq: Once | ORAL | 0 refills | Status: AC

## 2023-02-17 MED ORDER — AZITHROMYCIN 250 MG PO TABS: ORAL_TABLET | ORAL | 0 refills | Status: DC

## 2023-02-17 NOTE — ED Provider Notes (Signed)
 [facility]    CSN: [id number] Arrival date & time: 02/17/23  0844      History   Chief Complaint Chief Complaint  Patient presents with   Sore Throat    Fever, sore/red throat , white patches on throat. Body aches, congestion - Entered by patient    HPI Elaine Hernandez is a 28 y.o. female.   Patient presenting today with 1 day history of sore swollen feeling throat, fever, white spots to the back of throat, body aches.  States she was diagnosed with the flu 1 week ago and completed Tamiflu  and started to feel better prior to onset of the symptoms.  His cough, chest pain, shortness of breath, abdominal pain, nausea vomiting or diarrhea.  Trying over-the-counter pain relievers with minimal relief.    Past Medical History:  Diagnosis Date   Headache    Hypertension    Panic attacks    PONV (postoperative nausea and vomiting)     Patient Active Problem List   Diagnosis Date Noted   Menorrhagia with regular cycle 04/12/2021   Pregnancy examination or test, negative result 04/12/2021   Routine cervical smear 04/12/2021   Uterine cramping 04/12/2021   Sebaceous cyst of left axilla 01/22/2019   Chronic hypertension 02/01/2018   Foreign body, hand, superficial     Past Surgical History:  Procedure Laterality Date   CYST EXCISION Left 01/25/2019   Procedure: CYST REMOVAL;  Surgeon: Kallie Manuelita BROCKS, MD;  Location: [facility];  Service: General;  Laterality: Left;   FOREIGN BODY REMOVAL Left 09/30/2015   Procedure: FOREIGN BODY REMOVAL ADULT;  Surgeon: Taft FORBES Minerva, MD;  Location: [facility];  Service: Orthopedics;  Laterality: Left;  LEFT HAND   WISDOM TOOTH EXTRACTION Bilateral     OB History     Gravida  0   Para  0   Term      Preterm      AB      Living         SAB      IAB      Ectopic      Multiple      Live Births               Home Medications    Prior to Admission medications   Medication Sig Start Date End Date Taking?  Authorizing Provider  azithromycin  (ZITHROMAX ) 250 MG tablet Take first 2 tablets together, then 1 every day until finished. 02/17/23  Yes Stuart Vernell Norris, PA-C  fluconazole  (DIFLUCAN ) 150 MG tablet Take 1 tablet (150 mg total) by mouth once for 1 dose. 02/17/23 02/17/23 Yes Stuart Vernell Norris, PA-C  lidocaine  (XYLOCAINE ) 2 % solution Use as directed 10 mLs in the mouth or throat every 3 (three) hours as needed. 02/17/23  Yes Stuart Vernell Norris, PA-C  benzonatate  (TESSALON ) 100 MG capsule Take 1 capsule (100 mg total) by mouth 3 (three) times daily as needed for cough. Do not take with alcohol or while operating or driving heavy machinery 8/69/74   Chandra Harlene LABOR, NP  hydrochlorothiazide  (HYDRODIURIL ) 25 MG tablet Take 25 mg by mouth daily.    [provider]  olmesartan  (BENICAR ) 20 MG tablet Take 20 mg by mouth daily.    [provider]  amLODipine  (NORVASC ) 5 MG tablet Take 1 tablet (5 mg total) by mouth daily. 03/09/18 01/05/19  Signa Delon LABOR, NP    Family History Family History  Problem Relation Age of Onset  Hypertension Mother    Hypertension Father    Cancer Paternal Grandfather        lung   Cancer Paternal Grandmother        lung   Hypertension Maternal Grandmother    Alcohol abuse Maternal Grandfather     Social History Social History   Tobacco Use   Smoking status: Never   Smokeless tobacco: Never  Vaping Use   Vaping status: Never Used  Substance Use Topics   Alcohol use: Not Currently    Comment: occasional   Drug use: No     Allergies   Amoxicillin, Cefdinir, Other, and Smz-tmp ds [sulfamethoxazole-trimethoprim]   Review of Systems Review of Systems Per HPI  Physical Exam Triage Vital Signs ED Triage Vitals  Encounter Vitals Group     BP 02/17/23 0910 (!) 140/89     Systolic BP Percentile --      Diastolic BP Percentile --      Pulse Rate 02/17/23 0910 (!) 105     Resp 02/17/23 0910 20     Temp 02/17/23 0910  99.5 F (37.5 C)     Temp Source 02/17/23 0910 Oral     SpO2 02/17/23 0910 98 %     Weight --      Height --      Head Circumference --      Peak Flow --      Pain Score 02/17/23 0911 7     Pain Loc --      Pain Education --      Exclude from Growth Chart --    No data found.  Updated Vital Signs BP (!) 140/89 (BP Location: Right Arm)   Pulse (!) 105   Temp 99.5 F (37.5 C) (Oral)   Resp 20   LMP 01/24/2023 (Exact Date)   SpO2 98%   Visual Acuity Right Eye Distance:   Left Eye Distance:   Bilateral Distance:    Right Eye Near:   Left Eye Near:    Bilateral Near:     Physical Exam Vitals and nursing note reviewed.  Constitutional:      Appearance: Normal appearance.  HENT:     Head: Atraumatic.     Right Ear: Tympanic membrane and external ear normal.     Left Ear: Tympanic membrane and external ear normal.     Nose: Nose normal.     Mouth/Throat:     Mouth: Mucous membranes are moist.     Pharynx: Oropharyngeal exudate and posterior oropharyngeal erythema present.  Eyes:     Extraocular Movements: Extraocular movements intact.     Conjunctiva/sclera: Conjunctivae normal.  Cardiovascular:     Rate and Rhythm: Normal rate and regular rhythm.     Heart sounds: Normal heart sounds.  Pulmonary:     Effort: Pulmonary effort is normal.     Breath sounds: Normal breath sounds. No wheezing.  Musculoskeletal:        General: Normal range of motion.     Cervical back: Normal range of motion and neck supple.  Lymphadenopathy:     Cervical: Cervical adenopathy present.  Skin:    General: Skin is warm and dry.  Neurological:     Mental Status: She is alert and oriented to person, place, and time.  Psychiatric:        Mood and Affect: Mood normal.        Thought Content: Thought content normal.      UC Treatments / Results  Labs (all labs ordered are listed, but only abnormal results are displayed) Labs Reviewed  POCT RAPID STREP A (OFFICE) - Abnormal;  Notable for the following components:      Result Value   Rapid Strep A Screen Positive (*)    All other components within normal limits    EKG   Radiology No results found.  Procedures Procedures (including critical care time)  Medications Ordered in UC Medications - No data to display  Initial Impression / Assessment and Plan / UC Course  I have reviewed the triage vital signs and the nursing notes.  Pertinent labs & imaging results that were available during my care of the patient were reviewed by me and considered in my medical decision making (see chart for details).     Rapid strep positive, treat with Zithromax , viscous lidocaine , supportive over-the-counter medications and home care.  Work note given.  Return for worsening symptoms.  Patient also requested a Diflucan  which was sent.  Final Clinical Impressions(s) / UC Diagnoses   Final diagnoses:  Strep tonsillitis   Discharge Instructions   None    ED Prescriptions     Medication Sig Dispense Auth. Provider   azithromycin  (ZITHROMAX ) 250 MG tablet Take first 2 tablets together, then 1 every day until finished. 6 tablet Stuart Vernell Norris, PA-C   lidocaine  (XYLOCAINE ) 2 % solution Use as directed 10 mLs in the mouth or throat every 3 (three) hours as needed. 100 mL Stuart Vernell Norris, PA-C   fluconazole  (DIFLUCAN ) 150 MG tablet Take 1 tablet (150 mg total) by mouth once for 1 dose. 1 tablet Stuart Vernell Norris, [place]      PDMP not reviewed this encounter.   Stuart Vernell Firth, [place] 02/17/23 [phone]

## 2023-02-17 NOTE — ED Triage Notes (Signed)
 Pt reports she has had a sore throat, nasal congestion, more body aches, fever, and white spots on the back of her throat and hard to swallow x 1 day

## 2023-05-15 ENCOUNTER — Ambulatory Visit
Admission: RE | Admit: 2023-05-15 | Discharge: 2023-05-15 | Disposition: A | Discharge status: Home / Self Care | Source: Ambulatory Visit | Attending: Nurse Practitioner | Admitting: Nurse Practitioner

## 2023-05-15 VITALS — BP 163/92 | HR 83 | Temp 98.0°F | Resp 18

## 2023-05-15 DIAGNOSIS — S46811A Strain of other muscles, fascia and tendons at shoulder and upper arm level, right arm, initial encounter: Secondary | ICD-10-CM | POA: Diagnosis not present

## 2023-05-15 DIAGNOSIS — N62 Hypertrophy of breast: Secondary | ICD-10-CM | POA: Diagnosis not present

## 2023-05-15 MED ORDER — TIZANIDINE HCL 4 MG PO TABS: 4.0000 mg | ORAL_TABLET | Freq: Three times a day (TID) | ORAL | 0 refills | Status: DC | PRN

## 2023-05-15 NOTE — ED Provider Notes (Signed)
 [facility]    CSN: [id number] Arrival date & time: 05/15/23  1146      History   Chief Complaint Chief Complaint  Patient presents with   Back Pain    Pain at the upper part of my back, Possible pulled muscle from work - Entered by patient    HPI Elaine Hernandez is a 28 y.o. female.   Patient presents today with 1 week history of right upper back pain that acutely worsened overnight last night.  She denies recent fall, trauma, or known injury to the area.  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  She denies known swelling, redness, or bruising to the area.  No numbness or tingling rating down her arms.  Ibuprofen  seems to help with the pain minimally.  Patient is also concerned for her breast size as cause of back pain.  Reports she has had evaluation in the past with plastic surgery for breast reduction, however never followed through with surgery and is requesting information today.    Past Medical History:  Diagnosis Date   Headache    Hypertension    Panic attacks    PONV (postoperative nausea and vomiting)     Patient Active Problem List   Diagnosis Date Noted   Menorrhagia with regular cycle 04/12/2021   Pregnancy examination or test, negative result 04/12/2021   Routine cervical smear 04/12/2021   Uterine cramping 04/12/2021   Sebaceous cyst of left axilla 01/22/2019   Chronic hypertension 02/01/2018   Foreign body, hand, superficial     Past Surgical History:  Procedure Laterality Date   CYST EXCISION Left 01/25/2019   Procedure: CYST REMOVAL;  Surgeon: Awilda Bogus, MD;  Location: [facility];  Service: General;  Laterality: Left;   FOREIGN BODY REMOVAL Left 09/30/2015   Procedure: FOREIGN BODY REMOVAL ADULT;  Surgeon: Darrin Emerald, MD;  Location: [facility];  Service: Orthopedics;  Laterality: Left;  LEFT HAND   WISDOM TOOTH EXTRACTION Bilateral     OB History     Gravida  0   Para  0    Term      Preterm      AB      Living         SAB      IAB      Ectopic      Multiple      Live Births               Home Medications    Prior to Admission medications   Medication Sig Start Date End Date Taking? Authorizing Provider  tiZANidine (ZANAFLEX) 4 MG tablet Take 1 tablet (4 mg total) by mouth every 8 (eight) hours as needed for muscle spasms. Do not take with alcohol or while driving or operating heavy machinery.  May cause drowsiness. 05/15/23  Yes Thena Fireman A, NP  hydrochlorothiazide (HYDRODIURIL) 25 MG tablet Take 25 mg by mouth daily.    [provider]  olmesartan (BENICAR) 20 MG tablet Take 20 mg by mouth daily.    [provider]  amLODipine  (NORVASC ) 5 MG tablet Take 1 tablet (5 mg total) by mouth daily. 03/09/18 01/05/19  Javan Messing, NP    Family History Family History  Problem Relation Age of Onset   Hypertension Mother    Hypertension Father    Cancer Paternal Grandfather        lung   Cancer Paternal  Grandmother        lung   Hypertension Maternal Grandmother    Alcohol abuse Maternal Grandfather     Social History Social History   Tobacco Use   Smoking status: Never   Smokeless tobacco: Never  Vaping Use   Vaping status: Never Used  Substance Use Topics   Alcohol use: Not Currently    Comment: occasional   Drug use: No     Allergies   Amoxicillin, Cefdinir, Other, and Smz-tmp ds [sulfamethoxazole-trimethoprim]   Review of Systems Review of Systems Per HPI  Physical Exam Triage Vital Signs ED Triage Vitals [05/15/23 1207]  Encounter Vitals Group     BP (!) 163/92     Systolic BP Percentile      Diastolic BP Percentile      Pulse Rate 83     Resp 18     Temp 98 F (36.7 C)     Temp Source Oral     SpO2 98 %     Weight      Height      Head Circumference      Peak Flow      Pain Score 7     Pain Loc      Pain Education      Exclude from Growth Chart    No data  found.  Updated Vital Signs BP (!) 163/92 (BP Location: Right Arm)   Pulse 83   Temp 98 F (36.7 C) (Oral)   Resp 18   SpO2 98%   Visual Acuity Right Eye Distance:   Left Eye Distance:   Bilateral Distance:    Right Eye Near:   Left Eye Near:    Bilateral Near:     Physical Exam Vitals and nursing note reviewed.  Constitutional:      General: She is not in acute distress.    Appearance: Normal appearance. She is not toxic-appearing.  HENT:     Mouth/Throat:     Mouth: Mucous membranes are moist.     Pharynx: Oropharynx is clear.  Pulmonary:     Effort: Pulmonary effort is normal. No respiratory distress.  Musculoskeletal:       Arms:     Comments: Tenderness to area marked; no swelling, bruising, or redness.  Tenderness is reproducible with palpation.  Hard lump appreciated in area of tenderness. Full ROM to spine and bilateral upper extremities and bilateral upper extremities are neurovascularly intact distally bilaterally.  Skin:    General: Skin is warm and dry.     Capillary Refill: Capillary refill takes less than 2 seconds.     Coloration: Skin is not jaundiced or pale.     Findings: No erythema.  Neurological:     Mental Status: She is alert and oriented to person, place, and time.  Psychiatric:        Behavior: Behavior is cooperative.      UC Treatments / Results  Labs (all labs ordered are listed, but only abnormal results are displayed) Labs Reviewed - No data to display  EKG   Radiology No results found.  Procedures Procedures (including critical care time)  Medications Ordered in UC Medications - No data to display  Initial Impression / Assessment and Plan / UC Course  I have reviewed the triage vital signs and the nursing notes.  Pertinent labs & imaging results that were available during my care of the patient were reviewed by me and considered in my medical decision making (see chart  for details).   Patient is mildly hypertensive in  triage today, otherwise vital signs are stable.  1. Strain of right trapezius muscle, initial encounter Suspect muscle strain; no red flags Treat with continued Tylenol /ibuprofen , warm compresses, muscle relaxant medication ER and return precautions discussed with patient Work excuse provided  2. Breast hypertrophy in female Contact information given for plastic surgery, we discussed this is likely a component of the back pain but not likely sole cause  The patient was given the opportunity to ask questions.  All questions answered to their satisfaction.  The patient is in agreement to this plan.    Final Clinical Impressions(s) / UC Diagnoses   Final diagnoses:  Strain of right trapezius muscle, initial encounter  Breast hypertrophy in female     Discharge Instructions      As we discussed, you have a strain of your trapezius muscle in your back.  This is most likely coming from a combination of lifting and pushing/pulling heavy objects as well as the enlarged breast size.  You can take Tylenol  [phone] mg every 6 hours for pain alternating with ibuprofen  600-800 mg every 8 hours for pain.  You can also take the muscle relaxant medication before bed to help relax muscles.  Recommend warm compresses and massage as well.  Follow up with PCP if pain does not improve with treatment or if it worsens.   Regarding enlarged breast size, recommend follow up with Plastic Surgery; contact information has been provided.   ED Prescriptions     Medication Sig Dispense Auth. Provider   tiZANidine (ZANAFLEX) 4 MG tablet Take 1 tablet (4 mg total) by mouth every 8 (eight) hours as needed for muscle spasms. Do not take with alcohol or while driving or operating heavy machinery.  May cause drowsiness. 30 tablet Wilhemena Harbour, NP      PDMP not reviewed this encounter.   Wilhemena Harbour, NP 05/15/23 1355

## 2023-05-15 NOTE — Discharge Instructions (Signed)
 As we discussed, you have a strain of your trapezius muscle in your back.  This is most likely coming from a combination of lifting and pushing/pulling heavy objects as well as the enlarged breast size.  You can take Tylenol  [phone] mg every 6 hours for pain alternating with ibuprofen  600-800 mg every 8 hours for pain.  You can also take the muscle relaxant medication before bed to help relax muscles.  Recommend warm compresses and massage as well.  Follow up with PCP if pain does not improve with treatment or if it worsens.   Regarding enlarged breast size, recommend follow up with Plastic Surgery; contact information has been provided.

## 2023-05-15 NOTE — ED Triage Notes (Signed)
 Pt reports upper back pain does a lot of pushing an pulling at work,x 1 week however the pain became severe last night. Pt is unsure if the pain has come from that or from her breasts being so large and heavy.

## 2023-07-03 ENCOUNTER — Ambulatory Visit
Admission: EM | Admit: 2023-07-03 | Discharge: 2023-07-03 | Disposition: A | Discharge status: Home / Self Care | Attending: Family Medicine | Admitting: Family Medicine

## 2023-07-03 DIAGNOSIS — R519 Headache, unspecified: Secondary | ICD-10-CM | POA: Diagnosis not present

## 2023-07-03 DIAGNOSIS — R030 Elevated blood-pressure reading, without diagnosis of hypertension: Secondary | ICD-10-CM

## 2023-07-03 DIAGNOSIS — I10 Essential (primary) hypertension: Secondary | ICD-10-CM | POA: Diagnosis not present

## 2023-07-03 MED ORDER — ACETAMINOPHEN 500 MG PO TABS
1000.0000 mg | ORAL_TABLET | Freq: Once | ORAL | Status: AC
Start: 2023-07-03 — End: 2023-07-03
  Administered 2023-07-03: 1000 mg via ORAL

## 2023-07-03 MED ORDER — OLMESARTAN MEDOXOMIL 20 MG PO TABS: 20.0000 mg | ORAL_TABLET | Freq: Every day | ORAL | 0 refills | Status: DC

## 2023-07-03 MED ORDER — HYDROCHLOROTHIAZIDE 25 MG PO TABS: 25.0000 mg | ORAL_TABLET | Freq: Every day | ORAL | 0 refills | Status: DC

## 2023-07-03 NOTE — ED Triage Notes (Signed)
 Pt states she ran out of blood pressure medication 4 days ago. Now having headache, dizziness, checked BP at [facility] and it was 170/102.  Called PCP and they were able to fill BP medication but they told her to get checked out tonight due to headache and high blood pressure readings today of 170/102.   Pt would like a note for work.

## 2023-07-04 NOTE — ED Provider Notes (Signed)
 [facility]    CSN: [id number] Arrival date & time: 07/03/23  1859      History   Chief Complaint Chief Complaint  Patient presents with   Hypertension    HPI Elaine Hernandez is a 28 y.o. female.   Patient presenting today following up on elevated blood pressure readings, mild headache throughout the day today.  She states she ran out of her blood pressure medication about 4 days ago, was feeling slightly lightheaded and having a headache so went to [facility] and checked blood pressure.  States readings were 170/102.  She called her primary care provider and was able to get a refill on her blood pressure medication, took dose around 1 PM today.  She denies chest pain, shortness of breath, abdominal pain, vomiting, visual change, mental status changes.  So far has not tried anything over-the-counter for the headache.  Needs a note for work.  Past medical history significant for hypertension, panic attacks.     Past Medical History:  Diagnosis Date   Headache    Hypertension    Panic attacks    PONV (postoperative nausea and vomiting)     Patient Active Problem List   Diagnosis Date Noted   Menorrhagia with regular cycle 04/12/2021   Pregnancy examination or test, negative result 04/12/2021   Routine cervical smear 04/12/2021   Uterine cramping 04/12/2021   Sebaceous cyst of left axilla 01/22/2019   Chronic hypertension 02/01/2018   Foreign body, hand, superficial     Past Surgical History:  Procedure Laterality Date   CYST EXCISION Left 01/25/2019   Procedure: CYST REMOVAL;  Surgeon: Kallie Manuelita BROCKS, MD;  Location: [facility];  Service: General;  Laterality: Left;   FOREIGN BODY REMOVAL Left 09/30/2015   Procedure: FOREIGN BODY REMOVAL ADULT;  Surgeon: Taft FORBES Minerva, MD;  Location: [facility];  Service: Orthopedics;  Laterality: Left;  LEFT HAND   WISDOM TOOTH EXTRACTION Bilateral     OB History     Gravida  0   Para  0   Term      Preterm      AB       Living         SAB      IAB      Ectopic      Multiple      Live Births               Home Medications    Prior to Admission medications   Medication Sig Start Date End Date Taking? Authorizing Provider  hydrochlorothiazide (HYDRODIURIL) 25 MG tablet Take 1 tablet (25 mg total) by mouth daily. 07/03/23   Stuart Vernell Norris, PA-C  olmesartan (BENICAR) 20 MG tablet Take 1 tablet (20 mg total) by mouth daily. 07/03/23   Stuart Vernell Norris, PA-C  tiZANidine  (ZANAFLEX ) 4 MG tablet Take 1 tablet (4 mg total) by mouth every 8 (eight) hours as needed for muscle spasms. Do not take with alcohol or while driving or operating heavy machinery.  May cause drowsiness. 05/15/23   Chandra Harlene LABOR, NP  amLODipine  (NORVASC ) 5 MG tablet Take 1 tablet (5 mg total) by mouth daily. 03/09/18 01/05/19  Signa Delon LABOR, NP    Family History Family History  Problem Relation Age of Onset   Hypertension Mother    Hypertension Father    Cancer Paternal Grandfather        lung   Cancer Paternal Grandmother  lung   Hypertension Maternal Grandmother    Alcohol abuse Maternal Grandfather     Social History Social History   Tobacco Use   Smoking status: Never   Smokeless tobacco: Never  Vaping Use   Vaping status: Never Used  Substance Use Topics   Alcohol use: Not Currently    Comment: occasional   Drug use: No     Allergies   Amoxicillin, Cefdinir, Other, and Smz-tmp ds [sulfamethoxazole-trimethoprim]   Review of Systems Review of Systems Per HPI  Physical Exam Triage Vital Signs ED Triage Vitals  Encounter Vitals Group     BP 07/03/23 1957 (!) 165/93     Girls Systolic BP Percentile --      Girls Diastolic BP Percentile --      Boys Systolic BP Percentile --      Boys Diastolic BP Percentile --      Pulse Rate 07/03/23 1957 80     Resp 07/03/23 1957 16     Temp 07/03/23 1957 98.5 F (36.9 C)     Temp Source 07/03/23 1957 Oral     SpO2 07/03/23 1957  98 %     Weight --      Height --      Head Circumference --      Peak Flow --      Pain Score 07/03/23 2003 6     Pain Loc --      Pain Education --      Exclude from Growth Chart --    No data found.  Updated Vital Signs BP (!) 165/93 (BP Location: Right Arm)   Pulse 80   Temp 98.5 F (36.9 C) (Oral)   Resp 16   LMP 06/13/2023 (Exact Date)   SpO2 98%   Visual Acuity Right Eye Distance:   Left Eye Distance:   Bilateral Distance:    Right Eye Near:   Left Eye Near:    Bilateral Near:     Physical Exam Vitals and nursing note reviewed.  Constitutional:      Appearance: Normal appearance. She is not ill-appearing.  HENT:     Head: Atraumatic.   Eyes:     Extraocular Movements: Extraocular movements intact.     Conjunctiva/sclera: Conjunctivae normal.     Pupils: Pupils are equal, round, and reactive to light.    Cardiovascular:     Rate and Rhythm: Normal rate and regular rhythm.     Heart sounds: Normal heart sounds.  Pulmonary:     Effort: Pulmonary effort is normal.     Breath sounds: Normal breath sounds.   Musculoskeletal:        General: Normal range of motion.     Cervical back: Normal range of motion and neck supple.   Skin:    General: Skin is warm and dry.   Neurological:     General: No focal deficit present.     Mental Status: She is alert and oriented to person, place, and time. Mental status is at baseline.   Psychiatric:        Mood and Affect: Mood normal.        Thought Content: Thought content normal.        Judgment: Judgment normal.      UC Treatments / Results  Labs (all labs ordered are listed, but only abnormal results are displayed) Labs Reviewed - No data to display  EKG   Radiology No results found.  Procedures Procedures (including critical care time)  Medications Ordered in UC Medications  acetaminophen  (TYLENOL ) tablet 1,000 mg (1,000 mg Oral Given 07/03/23 2021)    Initial Impression / Assessment and  Plan / UC Course  I have reviewed the triage vital signs and the nursing notes.  Pertinent labs & imaging results that were available during my care of the patient were reviewed by me and considered in my medical decision making (see chart for details).     Hypertensive in triage, otherwise vital signs reassuring.  Tylenol  given for headache, discussed continue close monitoring of home blood pressure readings now that back on medication.  No red flag findings on exam today or focal neurologic deficits.  Given additional refill on her blood pressure medications in case she runs out of her current supply, discussed lifestyle modifications, return precautions.  Final Clinical Impressions(s) / UC Diagnoses   Final diagnoses:  Elevated blood pressure reading  Essential hypertension  Acute nonintractable headache, unspecified headache type   Discharge Instructions   None    ED Prescriptions     Medication Sig Dispense Auth. Provider   olmesartan (BENICAR) 20 MG tablet Take 1 tablet (20 mg total) by mouth daily. 30 tablet Stuart Vernell Norris, PA-C   hydrochlorothiazide (HYDRODIURIL) 25 MG tablet Take 1 tablet (25 mg total) by mouth daily. 30 tablet Stuart Vernell Norris, [place]      PDMP not reviewed this encounter.   Stuart Vernell Norris, PA-C 07/04/23 1704

## 2023-07-25 ENCOUNTER — Encounter: Payer: Self-pay | Admitting: Emergency Medicine

## 2023-07-25 ENCOUNTER — Ambulatory Visit
Admission: EM | Admit: 2023-07-25 | Discharge: 2023-07-25 | Disposition: A | Discharge status: Home / Self Care | Attending: Internal Medicine | Admitting: Internal Medicine

## 2023-07-25 DIAGNOSIS — I10 Essential (primary) hypertension: Secondary | ICD-10-CM | POA: Diagnosis not present

## 2023-07-25 DIAGNOSIS — S46811A Strain of other muscles, fascia and tendons at shoulder and upper arm level, right arm, initial encounter: Secondary | ICD-10-CM | POA: Diagnosis not present

## 2023-07-25 DIAGNOSIS — N62 Hypertrophy of breast: Secondary | ICD-10-CM

## 2023-07-25 MED ORDER — BACLOFEN 10 MG PO TABS: 10.0000 mg | ORAL_TABLET | Freq: Three times a day (TID) | ORAL | 0 refills | Status: DC

## 2023-07-25 NOTE — ED Triage Notes (Signed)
 Right upper back pain on and off but became worse yesterday.

## 2023-07-25 NOTE — ED Provider Notes (Addendum)
 [facility]    CSN: [id number] Arrival date & time: 07/25/23  0801      History   Chief Complaint No chief complaint on file.   HPI Elaine Hernandez is a 28 y.o. female.   Elaine Hernandez is a 28 y.o. female presenting for chief complaint of right upper back/right neck pain that started a few weeks ago and has flared up again over the last 24-48 hours. She works at [employer] as a Production designer, theatre/television/film but is required to push and pull 95-100lbs worth of material at work. Additionally has hypertrophied breasts and wonders if this is contributing to pain. Denies recent trauma/injuries to the upper back, flank pain, urinary symptoms, rash, and midline back pain. Pain starts to the right of the neck and radiates to the right shoulder and scapular region of the right upper back. Pain is triggered by certain movements and position changes and is relieved with heat. No fall, trauma, numbness or tingling, saddle paresthesia, changes to bowel or urinary habits, extremity weakness, radicular symptoms.   She has been seen for similar symptoms 3 weeks ago and was prescribed a muscle relaxer. She picked up the muscle relaxer last night, took a dose, and states this helped with her back pain.   BP elevated in triage, history of HTN, taking olmesartan  and HCTZ as prescribed. Denies CP, SOB, palpitations, dizziness, extremity weakness, paresthesias.       Past Medical History:  Diagnosis Date   Headache    Hypertension    Panic attacks    PONV (postoperative nausea and vomiting)     Patient Active Problem List   Diagnosis Date Noted   Menorrhagia with regular cycle 04/12/2021   Pregnancy examination or test, negative result 04/12/2021   Routine cervical smear 04/12/2021   Uterine cramping 04/12/2021   Sebaceous cyst of left axilla 01/22/2019   Chronic hypertension 02/01/2018   Foreign body, hand, superficial     Past Surgical History:  Procedure Laterality Date   CYST EXCISION Left 01/25/2019    Procedure: CYST REMOVAL;  Surgeon: Kallie Manuelita BROCKS, MD;  Location: [facility];  Service: General;  Laterality: Left;   FOREIGN BODY REMOVAL Left 09/30/2015   Procedure: FOREIGN BODY REMOVAL ADULT;  Surgeon: Taft FORBES Minerva, MD;  Location: [facility];  Service: Orthopedics;  Laterality: Left;  LEFT HAND   WISDOM TOOTH EXTRACTION Bilateral     OB History     Gravida  0   Para  0   Term      Preterm      AB      Living         SAB      IAB      Ectopic      Multiple      Live Births               Home Medications    Prior to Admission medications   Medication Sig Start Date End Date Taking? Authorizing Provider  baclofen  (LIORESAL ) 10 MG tablet Take 1 tablet (10 mg total) by mouth 3 (three) times daily. 07/25/23  Yes Enedelia Dorna HERO, FNP  hydrochlorothiazide  (HYDRODIURIL ) 25 MG tablet Take 1 tablet (25 mg total) by mouth daily. 07/03/23   Stuart Vernell Norris, PA-C  olmesartan  (BENICAR ) 20 MG tablet Take 1 tablet (20 mg total) by mouth daily. 07/03/23   Stuart Vernell Norris, PA-C  tiZANidine  (ZANAFLEX ) 4 MG tablet Take 1 tablet (4 mg total) by mouth every 8 (  eight) hours as needed for muscle spasms. Do not take with alcohol or while driving or operating heavy machinery.  May cause drowsiness. 05/15/23   Chandra Harlene LABOR, NP  amLODipine  (NORVASC ) 5 MG tablet Take 1 tablet (5 mg total) by mouth daily. 03/09/18 01/05/19  Signa Delon LABOR, NP    Family History Family History  Problem Relation Age of Onset   Hypertension Mother    Hypertension Father    Cancer Paternal Grandfather        lung   Cancer Paternal Grandmother        lung   Hypertension Maternal Grandmother    Alcohol abuse Maternal Grandfather     Social History Social History   Tobacco Use   Smoking status: Never   Smokeless tobacco: Never  Vaping Use   Vaping status: Never Used  Substance Use Topics   Alcohol use: Not Currently    Comment: occasional   Drug use: No      Allergies   Amoxicillin, Cefdinir, Other, and Smz-tmp ds [sulfamethoxazole-trimethoprim]   Review of Systems Review of Systems Per HPI  Physical Exam Triage Vital Signs ED Triage Vitals  Encounter Vitals Group     BP 07/25/23 0817 (!) 153/90     Girls Systolic BP Percentile --      Girls Diastolic BP Percentile --      Boys Systolic BP Percentile --      Boys Diastolic BP Percentile --      Pulse Rate 07/25/23 0817 100     Resp 07/25/23 0817 18     Temp 07/25/23 0817 98.7 F (37.1 C)     Temp Source 07/25/23 0817 Oral     SpO2 07/25/23 0817 98 %     Weight --      Height --      Head Circumference --      Peak Flow --      Pain Score 07/25/23 0818 6     Pain Loc --      Pain Education --      Exclude from Growth Chart --    No data found.  Updated Vital Signs BP (!) 153/90 (BP Location: Right Arm)   Pulse 100   Temp 98.7 F (37.1 C) (Oral)   Resp 18   LMP 07/12/2023 (Exact Date)   SpO2 98%   Visual Acuity Right Eye Distance:   Left Eye Distance:   Bilateral Distance:    Right Eye Near:   Left Eye Near:    Bilateral Near:     Physical Exam Vitals and nursing note reviewed.  Constitutional:      Appearance: She is not ill-appearing or toxic-appearing.  HENT:     Head: Normocephalic and atraumatic.     Right Ear: Hearing and external ear normal.     Left Ear: Hearing and external ear normal.     Nose: Nose normal.     Mouth/Throat:     Lips: Pink.  Eyes:     General: Lids are normal. Vision grossly intact. Gaze aligned appropriately.     Extraocular Movements: Extraocular movements intact.     Conjunctiva/sclera: Conjunctivae normal.  Pulmonary:     Effort: Pulmonary effort is normal.  Musculoskeletal:     Right shoulder: Tenderness (Diffuse tenderness over the right trapezius) present. No swelling, deformity, effusion, laceration, bony tenderness or crepitus. Normal range of motion. Normal strength. Normal pulse.     Left shoulder: Normal.      Right  upper arm: Normal.     Left upper arm: Normal.     Cervical back: Neck supple. Spasms and tenderness present. No swelling, edema, deformity, erythema, signs of trauma, lacerations, rigidity, torticollis, bony tenderness or crepitus. Pain with movement and muscular tenderness (Tender to multiple points of palpation over the right trapezius muscle, no overlying rash to skin) present. No spinous process tenderness. Normal range of motion (full ROM of the neck despite pain).     Thoracic back: Normal.     Lumbar back: Normal.  Skin:    General: Skin is warm and dry.     Capillary Refill: Capillary refill takes less than 2 seconds.     Findings: No rash.  Neurological:     General: No focal deficit present.     Mental Status: She is alert and oriented to person, place, and time. Mental status is at baseline.     GCS: GCS eye subscore is 4. GCS verbal subscore is 5. GCS motor subscore is 6.     Cranial Nerves: Cranial nerves 2-12 are intact. No dysarthria or facial asymmetry.     Sensory: Sensation is intact.     Motor: Motor function is intact. No weakness, tremor, abnormal muscle tone or pronator drift.     Coordination: Coordination is intact. Romberg sign negative. Coordination normal. Finger-Nose-Finger Test normal.     Gait: Gait is intact.     Comments: Strength and sensation intact to bilateral upper and lower extremities (5/5). Moves all 4 extremities with normal coordination voluntarily. Non-focal neuro exam.   Psychiatric:        Mood and Affect: Mood normal.        Speech: Speech normal.        Behavior: Behavior normal.        Thought Content: Thought content normal.        Judgment: Judgment normal.      UC Treatments / Results  Labs (all labs ordered are listed, but only abnormal results are displayed) Labs Reviewed - No data to display  EKG   Radiology No results found.  Procedures Procedures (including critical care time)  Medications Ordered in  UC Medications - No data to display  Initial Impression / Assessment and Plan / UC Course  I have reviewed the triage vital signs and the nursing notes.  Pertinent labs & imaging results that were available during my care of the patient were reviewed by me and considered in my medical decision making (see chart for details).   1. Strain of right trapezius muscle Evaluation suggests pain is musculoskeletal in nature. Will manage this with rest, gentle ROM exercises, heat therapy, tylenol  as needed for pain, and as needed use of muscle relaxer. Drowsiness precautions discussed regarding muscle relaxer use. I'd like to change her muscle relaxer to baclofen  instead of tizanidine  for better therapeutic effect.  No NSAIDs due to HTN.   I suspect some of her tenderness and strain is related to breast hypertrophy. Advised to follow-up with PCP to discuss potential breast reduction options. In the meantime, wear high support bra.   Imaging: no indication for imaging based on stable musculoskeletal exam findings May follow-up with orthopedics as needed. Work/school excise note given.  2. Elevated BP reading in office with diagnosis of HTN BP elevated in clinic today likely secondary to pain. No red flag signs/symptoms indicating need for referral to ED due to elevated BP.  Discussed lifestyle and dietary changes to lower BP further. Advised to continue  taking BP medication as prescribed and follow-up with PCP to discuss management of HTN further.  BP Readings from Last 3 Encounters:  07/25/23 (!) 153/90  07/03/23 (!) 165/93  05/15/23 (!) 163/92    Counseled patient on potential for adverse effects with medications prescribed/recommended today, strict ER and return-to-clinic precautions discussed, patient verbalized understanding.    Final Clinical Impressions(s) / UC Diagnoses   Final diagnoses:  Strain of right trapezius muscle, initial encounter  Elevated blood pressure reading in office  with diagnosis of hypertension     Discharge Instructions      Your pain is likely due to a muscle strain which will improve on its own with time.   - Tylenol  1,000mg  every 6 hours as needed for pain.  - You may also take the prescribed muscle relaxer as directed as needed for muscle aches/spasm.  Do not take this medication and drive or drink alcohol as it can make you sleepy.  Mainly use this medicine at nighttime as needed. - Apply heat 20 minutes on then 20 minutes off and perform gentle range of motion exercises to the area of greatest pain to prevent muscle stiffness and provide further pain relief.   Red flag symptoms to watch out for are numbness/tingling to the legs, weakness, loss of bowel/bladder control, and/or worsening pain that does not respond well to medicines. Follow-up with your primary care provider or return to urgent care if your symptoms do not improve in the next 3 to 4 days with medications and interventions recommended today. If your symptoms are severe (red flag), please go to the emergency room.       ED Prescriptions     Medication Sig Dispense Auth. Provider   baclofen  (LIORESAL ) 10 MG tablet Take 1 tablet (10 mg total) by mouth 3 (three) times daily. 30 each Enedelia Dorna HERO, FNP      PDMP not reviewed this encounter.   Enedelia Dorna HERO, FNP 07/25/23 0839    Enedelia Dorna HERO, FNP 07/25/23 [phone]

## 2023-07-25 NOTE — Discharge Instructions (Signed)
 Your pain is likely due to a muscle strain which will improve on its own with time.   - Tylenol  1,000mg  every 6 hours as needed for pain.  - You may also take the prescribed muscle relaxer as directed as needed for muscle aches/spasm.  Do not take this medication and drive or drink alcohol as it can make you sleepy.  Mainly use this medicine at nighttime as needed. - Apply heat 20 minutes on then 20 minutes off and perform gentle range of motion exercises to the area of greatest pain to prevent muscle stiffness and provide further pain relief.   Red flag symptoms to watch out for are numbness/tingling to the legs, weakness, loss of bowel/bladder control, and/or worsening pain that does not respond well to medicines. Follow-up with your primary care provider or return to urgent care if your symptoms do not improve in the next 3 to 4 days with medications and interventions recommended today. If your symptoms are severe (red flag), please go to the emergency room.

## 2023-08-07 ENCOUNTER — Emergency Department (HOSPITAL_COMMUNITY)
Admission: EM | Admit: 2023-08-07 | Discharge: 2023-08-07 | Discharge status: Against Medical Advice | Attending: Emergency Medicine | Admitting: Emergency Medicine

## 2023-08-07 ENCOUNTER — Other Ambulatory Visit: Payer: Self-pay

## 2023-08-07 ENCOUNTER — Encounter (HOSPITAL_COMMUNITY): Payer: Self-pay | Admitting: Emergency Medicine

## 2023-08-07 DIAGNOSIS — S99922A Unspecified injury of left foot, initial encounter: Secondary | ICD-10-CM | POA: Insufficient documentation

## 2023-08-07 DIAGNOSIS — Z5321 Procedure and treatment not carried out due to patient leaving prior to being seen by health care provider: Secondary | ICD-10-CM | POA: Diagnosis not present

## 2023-08-07 DIAGNOSIS — Y9311 Activity, swimming: Secondary | ICD-10-CM | POA: Diagnosis not present

## 2023-08-07 DIAGNOSIS — X58XXXA Exposure to other specified factors, initial encounter: Secondary | ICD-10-CM | POA: Insufficient documentation

## 2023-08-07 NOTE — ED Triage Notes (Signed)
 Pt states she ripped her LT great toenail off on Thursday while swimming in a pool.  States she is still having pain and it keeps bleeding.  No bleeding at this time.  Reports work will not let her come back without being evaluated by provider

## 2023-08-23 ENCOUNTER — Ambulatory Visit
Admission: EM | Admit: 2023-08-23 | Discharge: 2023-08-23 | Disposition: A | Discharge status: Home / Self Care | Attending: Family Medicine | Admitting: Family Medicine

## 2023-08-23 DIAGNOSIS — R519 Headache, unspecified: Secondary | ICD-10-CM | POA: Diagnosis not present

## 2023-08-23 DIAGNOSIS — R030 Elevated blood-pressure reading, without diagnosis of hypertension: Secondary | ICD-10-CM | POA: Diagnosis not present

## 2023-08-23 NOTE — ED Provider Notes (Signed)
 [facility]    CSN: [id number] Arrival date & time: 08/23/23  1014      History   Chief Complaint No chief complaint on file.   HPI Elaine Hernandez is a 28 y.o. female.   Patient presenting today with ongoing issues with elevated blood pressure since Monday.  She does have a history of hypertension, currently only on the olmesartan  though previously she was also on hydrochlorothiazide  and at 1 point was prescribed amlodipine  but states she never started taking that.  She states she started feeling lightheaded and dizzy and has had intermittent headaches which all started at work on Monday.  States blood pressures at the work nurse office was within normal limits when laying but elevated to 160/100s upon standing so they called EMS and she was evaluated at the [facility] emergency department.  She states they diagnosed her with an early UTI despite no urinary symptoms and she was treated with antibiotics, blood pressure per patient was not really addressed but unfortunately the medical records are not accessible with in the setting.  She was discharged home and states her blood pressures continued to be elevated at home.  Currently denies chest pain, shortness of breath, abdominal pain, vomiting, diarrhea, mental status changes, weakness numbness or tingling.  Does have a mild headache but she states this is not abnormal for her.  States she is compliant with her olmesartan .  She has an appointment with her PCP tomorrow but states she cannot go back to work until she gets a blood pressure form signed for work.    Past Medical History:  Diagnosis Date   Headache    Hypertension    Panic attacks    PONV (postoperative nausea and vomiting)     Patient Active Problem List   Diagnosis Date Noted   Menorrhagia with regular cycle 04/12/2021   Pregnancy examination or test, negative result 04/12/2021   Routine cervical smear 04/12/2021   Uterine cramping 04/12/2021   Sebaceous  cyst of left axilla 01/22/2019   Chronic hypertension 02/01/2018   Foreign body, hand, superficial     Past Surgical History:  Procedure Laterality Date   CYST EXCISION Left 01/25/2019   Procedure: CYST REMOVAL;  Surgeon: Kallie Manuelita BROCKS, MD;  Location: [facility];  Service: General;  Laterality: Left;   FOREIGN BODY REMOVAL Left 09/30/2015   Procedure: FOREIGN BODY REMOVAL ADULT;  Surgeon: Taft FORBES Minerva, MD;  Location: [facility];  Service: Orthopedics;  Laterality: Left;  LEFT HAND   WISDOM TOOTH EXTRACTION Bilateral     OB History     Gravida  0   Para  0   Term      Preterm      AB      Living         SAB      IAB      Ectopic      Multiple      Live Births               Home Medications    Prior to Admission medications   Medication Sig Start Date End Date Taking? Authorizing Provider  baclofen  (LIORESAL ) 10 MG tablet Take 1 tablet (10 mg total) by mouth 3 (three) times daily. 07/25/23   Enedelia Dorna HERO, FNP  hydrochlorothiazide  (HYDRODIURIL ) 25 MG tablet Take 1 tablet (25 mg total) by mouth daily. 07/03/23   Stuart Vernell Norris, PA-C  olmesartan  (BENICAR ) 20 MG tablet Take 1 tablet (20 mg  total) by mouth daily. 07/03/23   Stuart Vernell Norris, PA-C  tiZANidine  (ZANAFLEX ) 4 MG tablet Take 1 tablet (4 mg total) by mouth every 8 (eight) hours as needed for muscle spasms. Do not take with alcohol or while driving or operating heavy machinery.  May cause drowsiness. 05/15/23   Chandra Harlene LABOR, NP  amLODipine  (NORVASC ) 5 MG tablet Take 1 tablet (5 mg total) by mouth daily. 03/09/18 01/05/19  Signa Delon LABOR, NP    Family History Family History  Problem Relation Age of Onset   Hypertension Mother    Hypertension Father    Cancer Paternal Grandfather        lung   Cancer Paternal Grandmother        lung   Hypertension Maternal Grandmother    Alcohol abuse Maternal Grandfather     Social History Social History   Tobacco Use   Smoking  status: Never   Smokeless tobacco: Never  Vaping Use   Vaping status: Never Used  Substance Use Topics   Alcohol use: Not Currently    Comment: occasional   Drug use: No     Allergies   Amoxicillin, Cefdinir, Other, and Smz-tmp ds [sulfamethoxazole-trimethoprim]   Review of Systems Review of Systems Per HPI  Physical Exam Triage Vital Signs ED Triage Vitals  Encounter Vitals Group     BP 08/23/23 1021 (!) 164/112     Girls Systolic BP Percentile --      Girls Diastolic BP Percentile --      Boys Systolic BP Percentile --      Boys Diastolic BP Percentile --      Pulse Rate 08/23/23 1021 84     Resp 08/23/23 1021 20     Temp 08/23/23 1021 98.5 F (36.9 C)     Temp Source 08/23/23 1021 Oral     SpO2 08/23/23 1021 100 %     Weight --      Height --      Head Circumference --      Peak Flow --      Pain Score 08/23/23 1023 0     Pain Loc --      Pain Education --      Exclude from Growth Chart --    No data found.  Updated Vital Signs BP (!) 163/98 (BP Location: Right Arm)   Pulse 85   Temp 98.5 F (36.9 C) (Oral)   Resp 20   LMP 08/05/2023 (Exact Date)   SpO2 100%   Visual Acuity Right Eye Distance:   Left Eye Distance:   Bilateral Distance:    Right Eye Near:   Left Eye Near:    Bilateral Near:     Physical Exam Vitals and nursing note reviewed.  Constitutional:      Appearance: Normal appearance. She is not ill-appearing.  HENT:     Head: Atraumatic.     Mouth/Throat:     Mouth: Mucous membranes are moist.  Eyes:     Extraocular Movements: Extraocular movements intact.     Conjunctiva/sclera: Conjunctivae normal.     Pupils: Pupils are equal, round, and reactive to light.  Cardiovascular:     Rate and Rhythm: Normal rate and regular rhythm.     Heart sounds: Normal heart sounds.  Pulmonary:     Effort: Pulmonary effort is normal.     Breath sounds: Normal breath sounds.  Musculoskeletal:        General: Normal range of motion.  Cervical back: Normal range of motion and neck supple.  Skin:    General: Skin is warm and dry.  Neurological:     General: No focal deficit present.     Mental Status: She is alert and oriented to person, place, and time.     Cranial Nerves: No cranial nerve deficit.     Motor: No weakness.     Gait: Gait normal.  Psychiatric:        Mood and Affect: Mood normal.        Thought Content: Thought content normal.        Judgment: Judgment normal.      UC Treatments / Results  Labs (all labs ordered are listed, but only abnormal results are displayed) Labs Reviewed - No data to display  EKG   Radiology No results found.  Procedures Procedures (including critical care time)  Medications Ordered in UC Medications - No data to display  Initial Impression / Assessment and Plan / UC Course  I have reviewed the triage vital signs and the nursing notes.  Pertinent labs & imaging results that were available during my care of the patient were reviewed by me and considered in my medical decision making (see chart for details).     Blood pressure continues to be elevated in clinic but she is asymptomatic and this is near her typical baseline per chart review and per patient's account.  She does have an appointment with her PCP tomorrow for further evaluation and ongoing work clearances but because she is asymptomatic with a stable EKG showing normal sinus rhythm at 77 bpm without acute ST elevations or other emergent findings we will clear her temporarily with parameters to go to work today until she can be seen by PCP tomorrow for ongoing clearance.  She is aware that if she becomes symptomatic or her blood pressures are elevated over 160/100 that she should be allowed to go home or seek care in the emergency department.  45 minutes spent today in chart review, direct patient care and evaluation, completion of forms  Final Clinical Impressions(s) / UC Diagnoses   Final diagnoses:   Elevated blood pressure reading  Acute nonintractable headache, unspecified headache type     Discharge Instructions      Continue your blood pressure regimen consistently, low-sodium diet, exercise, stay well-hydrated, keep stress levels as low as possible.  Follow-up with primary care tomorrow as scheduled for further evaluation    ED Prescriptions   None    PDMP not reviewed this encounter.   Stuart Vernell Norris, [place] 08/23/23 1300

## 2023-08-23 NOTE — Discharge Instructions (Addendum)
 Continue your blood pressure regimen consistently, low-sodium diet, exercise, stay well-hydrated, keep stress levels as low as possible.  Follow-up with primary care tomorrow as scheduled for further evaluation

## 2023-08-23 NOTE — ED Triage Notes (Signed)
 Pt reports elevated blood pressure since Monday experiencing, lightheaded and dizziness, states she was seen at work by her work Engineer, civil (consulting), Surveyor, minerals down she was fine but sitting and standing BP was elevated. Was sent from work by EMS to hospital.

## 2024-01-16 ENCOUNTER — Inpatient Hospital Stay (HOSPITAL_COMMUNITY)
Admission: AD | Admit: 2024-01-16 | Discharge: 2024-01-16 | Disposition: A | Discharge status: Home / Self Care | Attending: Obstetrics and Gynecology | Admitting: Obstetrics and Gynecology

## 2024-01-16 ENCOUNTER — Encounter (HOSPITAL_COMMUNITY): Payer: Self-pay | Admitting: *Deleted

## 2024-01-16 DIAGNOSIS — N949 Unspecified condition associated with female genital organs and menstrual cycle: Secondary | ICD-10-CM | POA: Insufficient documentation

## 2024-01-16 DIAGNOSIS — Z3A15 15 weeks gestation of pregnancy: Secondary | ICD-10-CM | POA: Insufficient documentation

## 2024-01-16 DIAGNOSIS — O26892 Other specified pregnancy related conditions, second trimester: Secondary | ICD-10-CM | POA: Insufficient documentation

## 2024-01-16 DIAGNOSIS — Z113 Encounter for screening for infections with a predominantly sexual mode of transmission: Secondary | ICD-10-CM | POA: Diagnosis present

## 2024-01-16 DIAGNOSIS — R1033 Periumbilical pain: Secondary | ICD-10-CM | POA: Diagnosis present

## 2024-01-16 LAB — URINALYSIS, ROUTINE W REFLEX MICROSCOPIC
Bacteria, UA: NONE SEEN
Bilirubin Urine: NEGATIVE
Glucose, UA: NEGATIVE mg/dL
Hgb urine dipstick: NEGATIVE
Ketones, ur: NEGATIVE mg/dL
Nitrite: NEGATIVE
Protein, ur: NEGATIVE mg/dL
Specific Gravity, Urine: 1.016 (ref 1.005–1.030)
pH: 5 (ref 5.0–8.0)

## 2024-01-16 LAB — WET PREP, GENITAL
Clue Cells Wet Prep HPF POC: NONE SEEN
Sperm: NONE SEEN
Trich, Wet Prep: NONE SEEN
WBC, Wet Prep HPF POC: <10
Yeast Wet Prep HPF POC: NONE SEEN

## 2024-01-16 NOTE — MAU Note (Signed)
 Elaine Hernandez was called for Triage and had gone to her car to get her phone charger.

## 2024-01-16 NOTE — Progress Notes (Signed)
 Dr Magali went to talk with pt and discuss d/c plan. D/C instructions given by MD and pt then d/c home by provider

## 2024-01-16 NOTE — MAU Note (Signed)
 Elaine Hernandez is a 29 y.o. at [redacted]w[redacted]d here in MAU reporting while at work she had a sharp pain just below her navel. Then she had some pressure in her lower abdomen. No pain when sitting just when she stands. Denies VB or LOF. No pain currently.   LMP: na Onset of complaint: today at work Pain score: 0 Vitals:   01/16/24 2021 01/16/24 2023  BP:  (!) 144/89  Pulse: (!) 105   Resp: 18   Temp: 98 F (36.7 C)   SpO2: 100%      FHT: 169  Lab orders placed from triage: u/a

## 2024-01-17 LAB — GC/CHLAMYDIA PROBE AMP (~~LOC~~) NOT AT ARMC
Chlamydia: NEGATIVE
Comment: NEGATIVE
Comment: NORMAL
Neisseria Gonorrhea: NEGATIVE

## 2024-01-17 NOTE — MAU Provider Note (Signed)
 " History     [id number]  Arrival date and time: 01/16/24 2006    Chief Complaint  Patient presents with   Abdominal Pain     HPI Elaine Hernandez is a 29 y.o. at [redacted]w[redacted]d) presents for intermittent lower abdominal pain exacerbated by movement.  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  She was at work today and started to notice a intermittent lower abdominal sharp/stabbing pain in her left lower quadrant.  Pain got better when she rested.  She called her OB provider and reach the nurse triage line who recommended coming to the MAU for further assessment.  She denies vaginal bleeding, leakage of fluid, contractions.    Past Medical History:  Diagnosis Date   Headache    Hypertension    Panic attacks    PONV (postoperative nausea and vomiting)     Past Surgical History:  Procedure Laterality Date   CYST EXCISION Left 01/25/2019   Procedure: CYST REMOVAL;  Surgeon: Kallie Manuelita BROCKS, MD;  Location: [facility];  Service: General;  Laterality: Left;   FOREIGN BODY REMOVAL Left 09/30/2015   Procedure: FOREIGN BODY REMOVAL ADULT;  Surgeon: Taft FORBES Minerva, MD;  Location: [facility];  Service: Orthopedics;  Laterality: Left;  LEFT HAND   WISDOM TOOTH EXTRACTION Bilateral     Family History  Problem Relation Age of Onset   Hypertension Mother    Hypertension Father    Cancer Paternal Grandfather        lung   Cancer Paternal Grandmother        lung   Hypertension Maternal Grandmother    Alcohol abuse Maternal Grandfather     Social History   Socioeconomic History   Marital status: Single    Spouse name: Not on file   Number of children: Not on file   Years of education: Not on file   Highest education level: Not on file  Occupational History   Not on file  Tobacco Use   Smoking status: Never   Smokeless tobacco: Never  Vaping Use   Vaping status: Never Used  Substance and Sexual Activity   Alcohol use: Not Currently    Comment:  occasional   Drug use: No   Sexual activity: Yes  Other Topics Concern   Not on file  Social History Narrative   Not on file   Social Drivers of Health   Tobacco Use: Low Risk (08/23/2023)   Patient History    Smoking Tobacco Use: Never    Smokeless Tobacco Use: Never    Passive Exposure: Not on file  Financial Resource Strain: Not on file  Food Insecurity: Not on file  Transportation Needs: Not on file  Physical Activity: Not on file  Stress: Not on file  Social Connections: Not on file  Intimate Partner Violence: Not on file  Depression (EYV7-0): Not on file  Alcohol Screen: Not on file  Housing: Not on file  Utilities: Not on file  Health Literacy: Not on file    Allergies[1]  Medications Ordered Prior to Encounter[2]  Pertinent positives and negative per HPI, all others reviewed and negative  Physical Exam   BP (!) 144/89   Pulse (!) 105   Temp 98 F (36.7 C)   Resp 18   Ht 5' 4 (1.626 m)   Wt 91.2 kg   LMP 08/05/2023 (Exact Date)   SpO2 100%   BMI 34.50 kg/m  Patient Vitals for the past 24 hrs:  BP Temp Pulse Resp SpO2 Height Weight  01/16/24 2023 (!) 144/89 -- -- -- -- -- --  01/16/24 2021 -- 98 F (36.7 C) (!) 105 18 100 % 5' 4 (1.626 m) 91.2 kg    Physical Exam Vitals and nursing note reviewed.  Constitutional:      Appearance: She is well-developed.  HENT:     Head: Normocephalic and atraumatic.     Mouth/Throat:     Mouth: Mucous membranes are moist.  Eyes:     Extraocular Movements: Extraocular movements intact.  Cardiovascular:     Rate and Rhythm: Normal rate and regular rhythm.  Pulmonary:     Effort: Pulmonary effort is normal.  Abdominal:     Palpations: Abdomen is soft.     Tenderness: There is abdominal tenderness in the left lower quadrant.  Skin:    Capillary Refill: Capillary refill takes less than 2 seconds.  Neurological:     General: No focal deficit present.     Mental Status: She is alert.      Labs Results  for orders placed or performed during the hospital encounter of 01/16/24 (from the past 24 hours)  Urinalysis, Routine w reflex microscopic -Urine, Clean Catch     Status: Abnormal   Collection Time: 01/16/24  8:40 PM  Result Value Ref Range   Color, Urine YELLOW YELLOW   APPearance CLEAR CLEAR   Specific Gravity, Urine 1.016 1.005 - 1.030   pH 5.0 5.0 - 8.0   Glucose, UA NEGATIVE NEGATIVE mg/dL   Hgb urine dipstick NEGATIVE NEGATIVE   Bilirubin Urine NEGATIVE NEGATIVE   Ketones, ur NEGATIVE NEGATIVE mg/dL   Protein, ur NEGATIVE NEGATIVE mg/dL   Nitrite NEGATIVE NEGATIVE   Leukocytes,Ua MODERATE (A) NEGATIVE   RBC / HPF 0-5 0 - 5 RBC/hpf   WBC, UA 0-5 0 - 5 WBC/hpf   Bacteria, UA NONE SEEN NONE SEEN   Squamous Epithelial / HPF 0-5 0 - 5 /HPF  Wet prep, genital     Status: None   Collection Time: 01/16/24  9:26 PM  Result Value Ref Range   Yeast Wet Prep HPF POC NONE SEEN NONE SEEN   Trich, Wet Prep NONE SEEN NONE SEEN   Clue Cells Wet Prep HPF POC NONE SEEN NONE SEEN   WBC, Wet Prep HPF POC <10 <10   Sperm NONE SEEN     Imaging No results found.  MAU Course  Procedures  Lab Orders         Wet prep, genital         Urinalysis, Routine w reflex microscopic -Urine, Clean Catch    No orders of the defined types were placed in this encounter.  Imaging Orders  No imaging studies ordered today    MDM Low (Level 2)  Assessment and Plan  Round ligament pain  [redacted] weeks gestation of pregnancy   Elaine Hernandez is a 29 y.o. at [redacted]w[redacted]d) presents for intermittent lower abdominal pain exacerbated by movement.   - Denies vaginal bleeding, vaginal discharge, leakage of fluid, contractions - Wet prep not indicative of infectious process. - GC chlamydia probe pending - UA not indicative of infectious process. - Using shared decision making, cervical exam not performed at this time  - Stable for discharge home - Likely round ligament pain in the setting of manual labor and  lifting at work. - Discussed utility of maternity support belt. - Work note provided -  Recommended discussing with OB provider lift restrictions - All questions answered, discharge guidance, and PTL precautions provided.      [name] L Jibran Crookshanks, MD/MHA 01/17/2024 12:50 AM  Allergies as of 01/16/2024       Reactions   Amoxicillin Hives   Did it involve swelling of the face/tongue/throat, SOB, or low BP? No Did it involve sudden or severe rash/hives, skin peeling, or any reaction on the inside of your mouth or nose? No Did you need to seek medical attention at a hospital or doctor's office? Yes When did it last happen? 2011       If all above answers are NO, may proceed with cephalosporin use.   Cefdinir Hives   Possible Patient is unsure   Other Other (See Comments)   Citrus fruits. Tongue and throat burning.   Smz-tmp Ds [sulfamethoxazole-trimethoprim] Hives   Bactrim        Medication List     STOP taking these medications    baclofen  10 MG tablet Commonly known as: LIORESAL    hydrochlorothiazide  25 MG tablet Commonly known as: HYDRODIURIL    olmesartan  20 MG tablet Commonly known as: BENICAR    tiZANidine  4 MG tablet Commonly known as: Zanaflex        TAKE these medications    NIFEdipine 30 MG 24 hr tablet Commonly known as: ADALAT CC Take 30 mg by mouth daily.   prenatal multivitamin Tabs tablet Take 1 tablet by mouth daily at 12 noon.           [1]  Allergies Allergen Reactions   Amoxicillin Hives    Did it involve swelling of the face/tongue/throat, SOB, or low BP? No Did it involve sudden or severe rash/hives, skin peeling, or any reaction on the inside of your mouth or nose? No Did you need to seek medical attention at a hospital or doctor's office? Yes When did it last happen? 2011       If all above answers are NO, may proceed with cephalosporin use.    Cefdinir Hives    Possible Patient is unsure   Other Other (See Comments)     Citrus fruits. Tongue and throat burning.   Smz-Tmp Ds [Sulfamethoxazole-Trimethoprim] Hives    Bactrim   [2]  No current facility-administered medications on file prior to encounter.   Current Outpatient Medications on File Prior to Encounter  Medication Sig Dispense Refill   NIFEdipine (ADALAT CC) 30 MG 24 hr tablet Take 30 mg by mouth daily.     Prenatal Vit-Fe Fumarate-FA (PRENATAL MULTIVITAMIN) TABS tablet Take 1 tablet by mouth daily at 12 noon.     [DISCONTINUED] amLODipine  (NORVASC ) 5 MG tablet Take 1 tablet (5 mg total) by mouth daily. 30 tablet 6   "
